# Patient Record
Sex: Male | Born: 1985 | Race: White | Hispanic: No | Marital: Single | Smoking: Never smoker
Health system: Southern US, Community
[De-identification: ages and names within clinical notes are randomized; demographics above are authoritative.]

---

## 2019-10-14 ENCOUNTER — Emergency Department (HOSPITAL_COMMUNITY): Payer: No Typology Code available for payment source

## 2019-10-14 ENCOUNTER — Emergency Department (HOSPITAL_COMMUNITY)
Admission: EM | Admit: 2019-10-14 | Discharge: 2019-10-14 | Disposition: A | Discharge status: Home / Self Care | Payer: No Typology Code available for payment source | Attending: Emergency Medicine | Admitting: Emergency Medicine

## 2019-10-14 ENCOUNTER — Encounter (HOSPITAL_COMMUNITY): Payer: Self-pay | Admitting: Emergency Medicine

## 2019-10-14 ENCOUNTER — Other Ambulatory Visit: Payer: Self-pay

## 2019-10-14 DIAGNOSIS — R42 Dizziness and giddiness: Secondary | ICD-10-CM | POA: Diagnosis not present

## 2019-10-14 DIAGNOSIS — U071 COVID-19: Secondary | ICD-10-CM | POA: Insufficient documentation

## 2019-10-14 LAB — CBC WITH DIFFERENTIAL/PLATELET
Abs Immature Granulocytes: 0.05 10*3/uL (ref 0.00–0.07)
Basophils Absolute: 0 10*3/uL (ref 0.0–0.1)
Basophils Relative: 0 %
Eosinophils Absolute: 0.1 10*3/uL (ref 0.0–0.5)
Eosinophils Relative: 3 %
HCT: 43.8 % (ref 39.0–52.0)
Hemoglobin: 14 g/dL (ref 13.0–17.0)
Immature Granulocytes: 1 %
Lymphocytes Relative: 27 %
Lymphs Abs: 1.5 10*3/uL (ref 0.7–4.0)
MCH: 27.6 pg (ref 26.0–34.0)
MCHC: 32 g/dL (ref 30.0–36.0)
MCV: 86.2 fL (ref 80.0–100.0)
Monocytes Absolute: 0.5 10*3/uL (ref 0.1–1.0)
Monocytes Relative: 10 %
Neutro Abs: 3.3 10*3/uL (ref 1.7–7.7)
Neutrophils Relative %: 59 %
Platelets: 238 10*3/uL (ref 150–400)
RBC: 5.08 MIL/uL (ref 4.22–5.81)
RDW: 13.3 % (ref 11.5–15.5)
WBC Morphology: ABNORMAL
WBC: 5.5 10*3/uL (ref 4.0–10.5)
nRBC: 0 % (ref 0.0–0.2)

## 2019-10-14 LAB — BASIC METABOLIC PANEL
Anion gap: 12 (ref 5–15)
BUN: 9 mg/dL (ref 6–20)
CO2: 23 mmol/L (ref 22–32)
Calcium: 8.9 mg/dL (ref 8.9–10.3)
Chloride: 105 mmol/L (ref 98–111)
Creatinine, Ser: 0.76 mg/dL (ref 0.61–1.24)
GFR calc Af Amer: >60 mL/min (ref >60)
GFR calc non Af Amer: >60 mL/min (ref >60)
Glucose, Bld: 99 mg/dL (ref 70–99)
Potassium: 4 mmol/L (ref 3.5–5.1)
Sodium: 140 mmol/L (ref 135–145)

## 2019-10-14 IMAGING — DX DG CHEST 1V PORT
1 series · 1 of 1 positions shown · non-contrast
Comparison: None.

CLINICAL DATA: [JM] positive, cough, tachycardia

EXAM:
PORTABLE CHEST 1 VIEW

[chest ap]
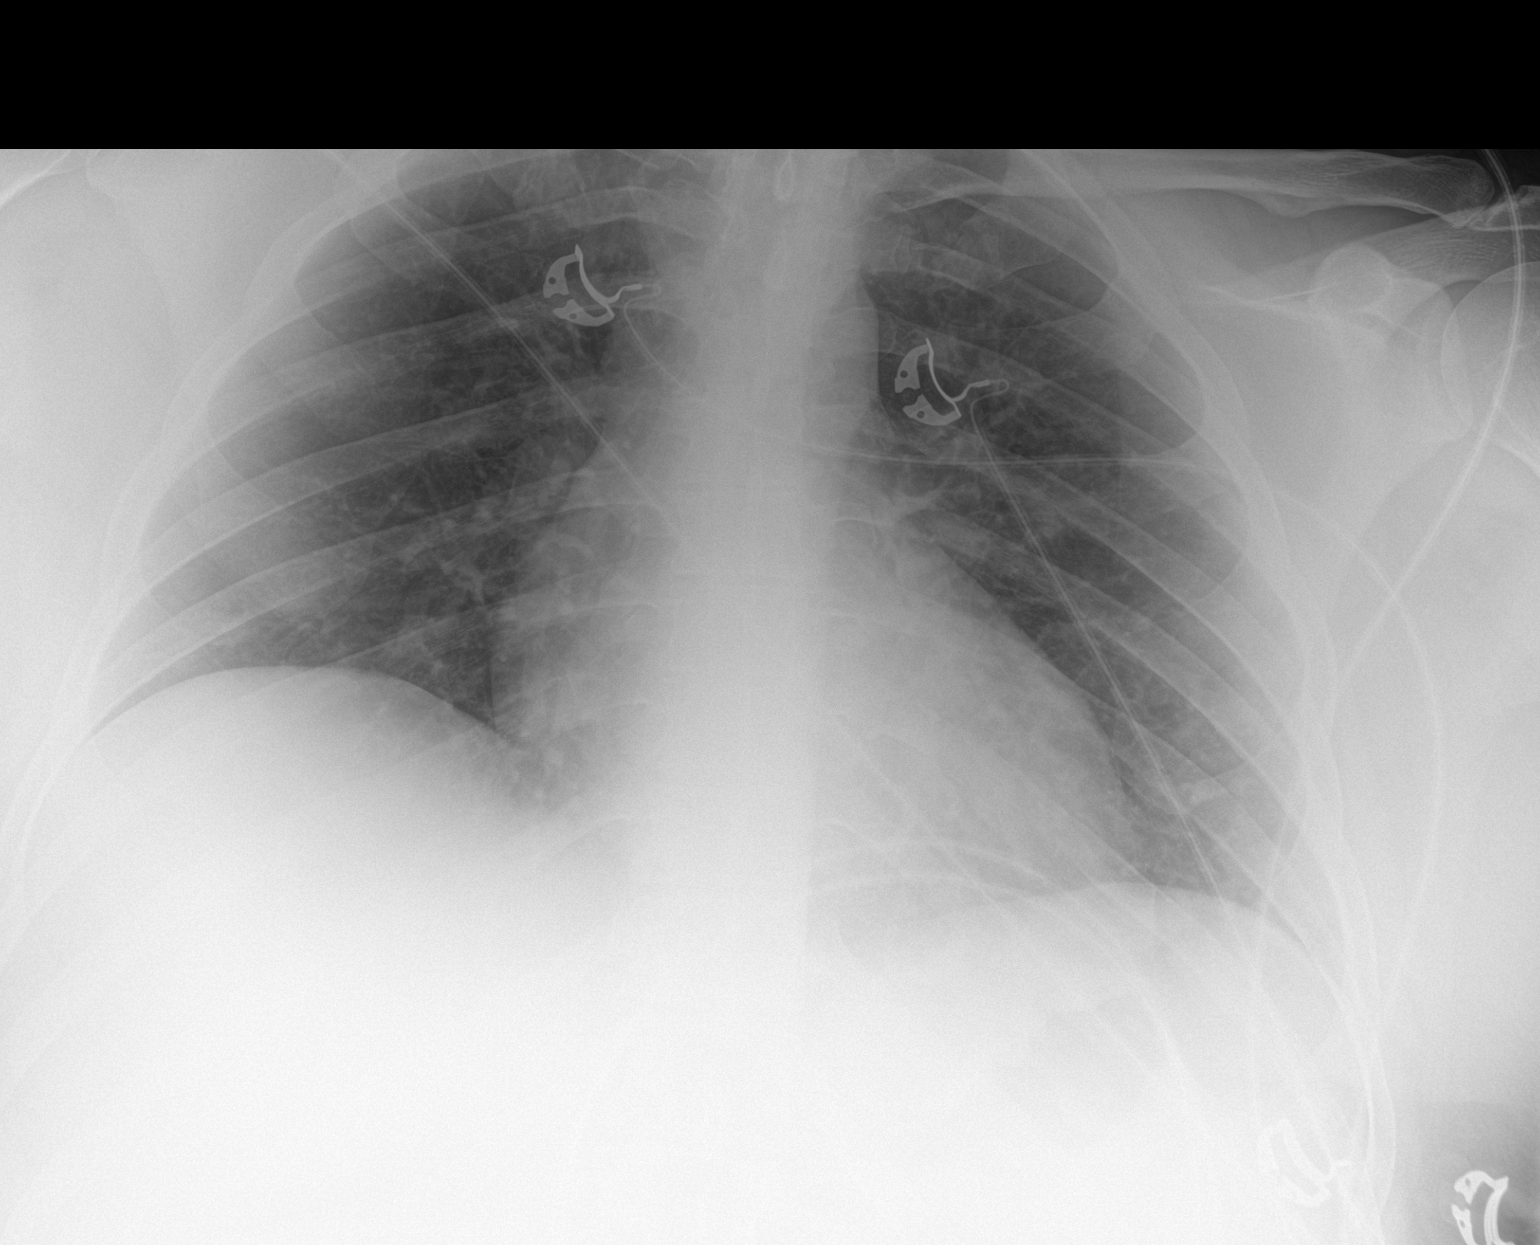

[1 of 1 positions shown; findings below may reference images not displayed]

FINDINGS: Normal heart size. Normal mediastinal contour. No pneumothorax. No
pleural effusion. Lungs appear clear, with no acute consolidative
airspace disease and no pulmonary edema.
IMPRESSION: No active disease.

## 2019-10-14 MED ORDER — ONDANSETRON 4 MG PO TBDP: 4.0000 mg | ORAL_TABLET | Freq: Three times a day (TID) | ORAL | 0 refills | Status: DC | PRN

## 2019-10-14 MED ORDER — SODIUM CHLORIDE 0.9 % IV BOLUS
1000.0000 mL | Freq: Once | INTRAVENOUS | Status: AC
  Administered 2019-10-14: 1000 mL via INTRAVENOUS

## 2019-10-14 NOTE — ED Provider Notes (Signed)
[facility]-EMERGENCY DEPT Provider Note   CSN: [id number] Arrival date & time: 10/14/19  1514     History Chief Complaint  Patient presents with  . covid positive    8/13    Victor Marquez is a 34 y.o. male who presents to ED with a chief complaint of fatigue, myalgias, productive cough, feeling like he is dehydrated.  He started having flulike symptoms on 10/03/2019.  He tested positive for Covid on 10/05/2019.  Since then he has been trying to manage his symptoms with over-the-counter cold and flu medications.  Did have some improvement in his symptoms until everything worsened about today.  He can feel his heart racing anytime he tries to exert himself.  Denies any chest pain.  Does report some decreased appetite and feeling "like I am malnourished."  Denies any neck stiffness, vomiting, bloody stools or chronic lung disease.  HPI     History reviewed. No pertinent past medical history.  There are no problems to display for this patient.   History reviewed. No pertinent surgical history.     No family history on file.  Social History   Tobacco Use  . Smoking status: Not on file  Substance Use Topics  . Alcohol use: Not on file  . Drug use: Not on file    Home Medications Prior to Admission medications   Medication Sig Start Date End Date Taking? Authorizing Provider  ondansetron (ZOFRAN ODT) 4 MG disintegrating tablet Take 1 tablet (4 mg total) by mouth every 8 (eight) hours as needed for nausea or vomiting. 10/14/19   Dietrich Pates, PA-C    Allergies    Patient has no known allergies.  Review of Systems   Review of Systems  Constitutional: Positive for appetite change, chills and fatigue. Negative for fever.  HENT: Negative for ear pain, rhinorrhea, sneezing and sore throat.   Eyes: Negative for photophobia and visual disturbance.  Respiratory: Positive for cough. Negative for chest tightness, shortness of breath and wheezing.     Cardiovascular: Negative for chest pain and palpitations.  Gastrointestinal: Negative for abdominal pain, blood in stool, constipation, diarrhea, nausea and vomiting.  Genitourinary: Negative for dysuria, hematuria and urgency.  Musculoskeletal: Positive for myalgias.  Skin: Negative for rash.  Neurological: Positive for light-headedness. Negative for dizziness and weakness.    Physical Exam Updated Vital Signs BP (!) 124/93   Pulse 74   Temp 98.9 F (37.2 C) (Oral)   Resp (!) 23   SpO2 96%   Physical Exam Vitals and nursing note reviewed.  Constitutional:      General: He is not in acute distress.    Appearance: He is well-developed. He is obese.  HENT:     Head: Normocephalic and atraumatic.     Nose: Nose normal.  Eyes:     General: No scleral icterus.       Left eye: No discharge.     Conjunctiva/sclera: Conjunctivae normal.  Cardiovascular:     Rate and Rhythm: Normal rate and regular rhythm.     Heart sounds: Normal heart sounds. No murmur heard.  No friction rub. No gallop.   Pulmonary:     Effort: Pulmonary effort is normal. No respiratory distress.     Breath sounds: Normal breath sounds.  Abdominal:     General: Bowel sounds are normal. There is no distension.     Palpations: Abdomen is soft.     Tenderness: There is no abdominal tenderness. There is no guarding.  Musculoskeletal:        General: Normal range of motion.     Cervical back: Normal range of motion and neck supple.  Skin:    General: Skin is warm and dry.     Findings: No rash.  Neurological:     Mental Status: He is alert.     Motor: No abnormal muscle tone.     Coordination: Coordination normal.     ED Results / Procedures / Treatments   Labs (all labs ordered are listed, but only abnormal results are displayed) Labs Reviewed  BASIC METABOLIC PANEL  CBC WITH DIFFERENTIAL/PLATELET    EKG EKG Interpretation  Date/Time:  Sunday October 14 2019 15:54:52 EDT Ventricular Rate:   87 PR Interval:    QRS Duration: 102 QT Interval:  375 QTC Calculation: 452 R Axis:   55 Text Interpretation: Sinus rhythm No old tracing to compare Confirmed by Linwood Dibbles [phone]) on 10/14/2019 4:16:46 PM   Radiology DG Chest Portable 1 View  Result Date: 10/14/2019 CLINICAL DATA:  COVID-19 positive, cough, tachycardia EXAM: PORTABLE CHEST 1 VIEW COMPARISON:  None. FINDINGS: Normal heart size. Normal mediastinal contour. No pneumothorax. No pleural effusion. Lungs appear clear, with no acute consolidative airspace disease and no pulmonary edema. IMPRESSION: No active disease. Electronically Signed   By: Delbert Phenix M.D.   On: 10/14/2019 16:52    Procedures Procedures (including critical care time)  Medications Ordered in ED Medications  sodium chloride 0.9 % bolus 1,000 mL (1,000 mLs Intravenous New Bag/Given 10/14/19 1626)    ED Course  I have reviewed the triage vital signs and the nursing notes.  Pertinent labs & imaging results that were available during my care of the patient were reviewed by me and considered in my medical decision making (see chart for details).    MDM Rules/Calculators/A&P                          [name] was evaluated in Emergency Department on 10/14/19 for the symptoms described in the history of present illness. He/she was evaluated in the context of the global COVID-19 pandemic, which necessitated consideration that the patient might be at risk for infection with the SARS-CoV-2 virus that causes COVID-19. Institutional protocols and algorithms that pertain to the evaluation of patients at risk for COVID-19 are in a state of rapid change based on information released by regulatory bodies including the CDC and federal and state organizations. These policies and algorithms were followed during the patient's care in the ED.  34 year old male presenting to the ED with fatigue, myalgias, productive cough and decreased appetite since being diagnosed with  Covid.  Symptoms began on 10/03/2019 and he test positive on 10/05/2019.  He has not been vaccinated against Covid.  Minimal improvement noted with over-the-counter medications.  On exam here patient initially mildly tachycardic.  He does have a cough however lungs are clear to auscultation bilaterally.  He is not hypoxic.  Chest x-ray without any acute findings.  CBC and BMP are unremarkable.  EKG shows sinus rhythm.  Patient given IV fluids here with significant improvement in his symptoms.  Tachycardia has improved.  Suspect that his symptoms are due to his Covid infection.  Will treat with Zofran as needed, advised him to continue pushing fluids and following up with PCP.   Patient is hemodynamically stable, in NAD, and able to ambulate in the ED. Evaluation does not show pathology that would require ongoing emergent  intervention or inpatient treatment. I explained the diagnosis to the patient. Pain has been managed and has no complaints prior to discharge. Patient is comfortable with above plan and is stable for discharge at this time. All questions were answered prior to disposition. Strict return precautions for returning to the ED were discussed. Encouraged follow up with PCP.   An After Visit Summary was printed and given to the patient.   Portions of this note were generated with Scientist, clinical (histocompatibility and immunogenetics). Dictation errors may occur despite best attempts at proofreading.  Final Clinical Impression(s) / ED Diagnoses Final diagnoses:  COVID-19 virus infection    Rx / DC Orders ED Discharge Orders         Ordered    ondansetron (ZOFRAN ODT) 4 MG disintegrating tablet  Every 8 hours PRN        10/14/19 1754           Dietrich Pates, PA-C 10/14/19 1802    Cathren Laine, MD 10/14/19 [phone]

## 2019-10-14 NOTE — Discharge Instructions (Signed)
Take the Zofran as needed to help with your nausea. Drink plenty of fluids and slowly advance your diet as tolerated. Return to the ER if you start to develop chest pain, shortness of breath, numbness in arms or legs, injuries or falls.

## 2019-10-14 NOTE — ED Triage Notes (Signed)
Pt reports tested positive for covid on 8/13 at [facility] on [address] in [facility] Reports that symptoms aren't getting better. Reports HR races with exertion ranging from 100-123bpm. Reports shaking a lot with chills. Reports feels like head floating around.

## 2019-10-18 ENCOUNTER — Other Ambulatory Visit: Payer: Self-pay

## 2019-10-18 ENCOUNTER — Emergency Department (HOSPITAL_COMMUNITY): Payer: No Typology Code available for payment source

## 2019-10-18 ENCOUNTER — Emergency Department (HOSPITAL_COMMUNITY)
Admission: EM | Admit: 2019-10-18 | Discharge: 2019-10-18 | Disposition: A | Discharge status: Home / Self Care | Payer: No Typology Code available for payment source | Attending: Emergency Medicine | Admitting: Emergency Medicine

## 2019-10-18 ENCOUNTER — Encounter (HOSPITAL_COMMUNITY): Payer: Self-pay

## 2019-10-18 DIAGNOSIS — H6501 Acute serous otitis media, right ear: Secondary | ICD-10-CM | POA: Diagnosis not present

## 2019-10-18 DIAGNOSIS — Z8616 Personal history of COVID-19: Secondary | ICD-10-CM | POA: Insufficient documentation

## 2019-10-18 DIAGNOSIS — R42 Dizziness and giddiness: Secondary | ICD-10-CM | POA: Diagnosis not present

## 2019-10-18 DIAGNOSIS — H65191 Other acute nonsuppurative otitis media, right ear: Secondary | ICD-10-CM

## 2019-10-18 LAB — BASIC METABOLIC PANEL
Anion gap: 9 (ref 5–15)
BUN: 10 mg/dL (ref 6–20)
CO2: 24 mmol/L (ref 22–32)
Calcium: 8.9 mg/dL (ref 8.9–10.3)
Chloride: 106 mmol/L (ref 98–111)
Creatinine, Ser: 0.76 mg/dL (ref 0.61–1.24)
GFR calc Af Amer: >60 mL/min (ref >60)
GFR calc non Af Amer: >60 mL/min (ref >60)
Glucose, Bld: 96 mg/dL (ref 70–99)
Potassium: 4.2 mmol/L (ref 3.5–5.1)
Sodium: 139 mmol/L (ref 135–145)

## 2019-10-18 LAB — CBC
HCT: 41 % (ref 39.0–52.0)
Hemoglobin: 13.5 g/dL (ref 13.0–17.0)
MCH: 28 pg (ref 26.0–34.0)
MCHC: 32.9 g/dL (ref 30.0–36.0)
MCV: 85.1 fL (ref 80.0–100.0)
Platelets: 380 10*3/uL (ref 150–400)
RBC: 4.82 MIL/uL (ref 4.22–5.81)
RDW: 13 % (ref 11.5–15.5)
WBC: 7.7 10*3/uL (ref 4.0–10.5)
nRBC: 0 % (ref 0.0–0.2)

## 2019-10-18 LAB — MAGNESIUM: Magnesium: 2.3 mg/dL (ref 1.7–2.4)

## 2019-10-18 IMAGING — MR MR HEAD W/O CM
10 series · 43 of 48 positions shown · non-contrast
Comparison: None.

CLINICAL DATA: 34-year-old male positive [OG]. Vertigo x4 days.

EXAM:
MRI HEAD WITHOUT CONTRAST
TECHNIQUE: Multiplanar, multiecho pulse sequences of the brain and surrounding
structures were obtained without intravenous contrast.

[Series 5: dwi_tracew · axial · 3.0mm · 0.88mm/px · z∈[+1,+147]mm · 8 of 102 slices shown]
[im 1/102]
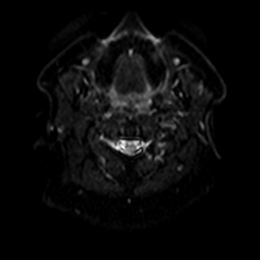
[im 19/102]
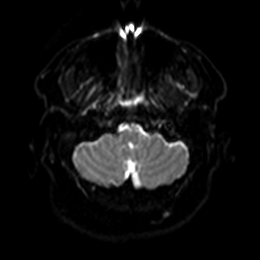
[im 28/102]
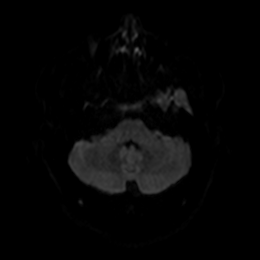
[im 46/102]
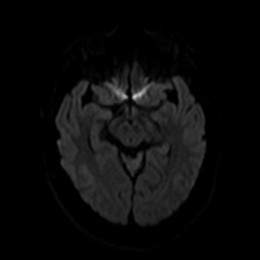
[im 56/102]
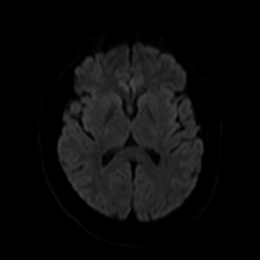
[im 74/102]
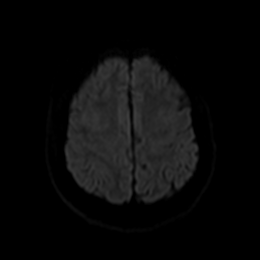
[im 83/102]
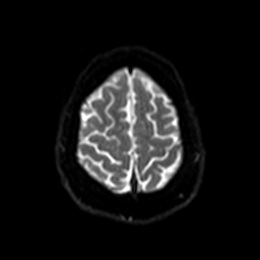
[im 102/102]
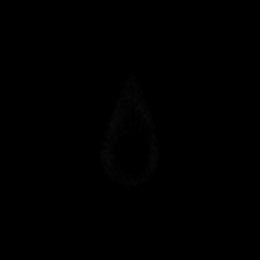

[Series 6: dwi_adc · axial · 3.0mm · 0.88mm/px · z∈[+1,+109]mm · 4 of 51 slices shown]
[im 1/51]
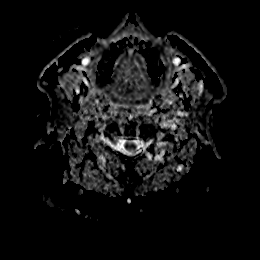
[im 13/51]
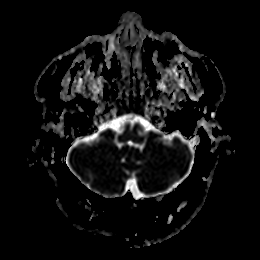
[im 26/51]
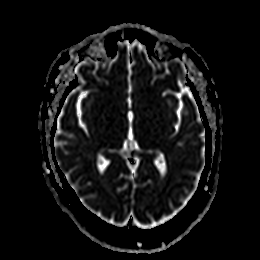
[im 38/51]
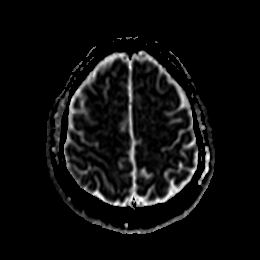

[Series 7: T2 · sagittal · 5.0mm · 0.47mm/px · 3 of 26 slices shown (1 of 3)]
[im 1/26]
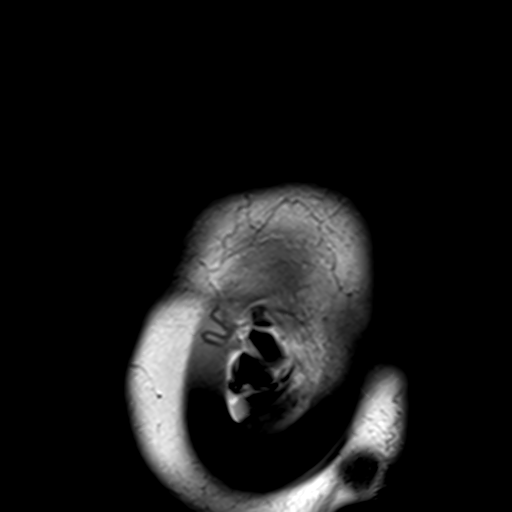
[im 13/26]
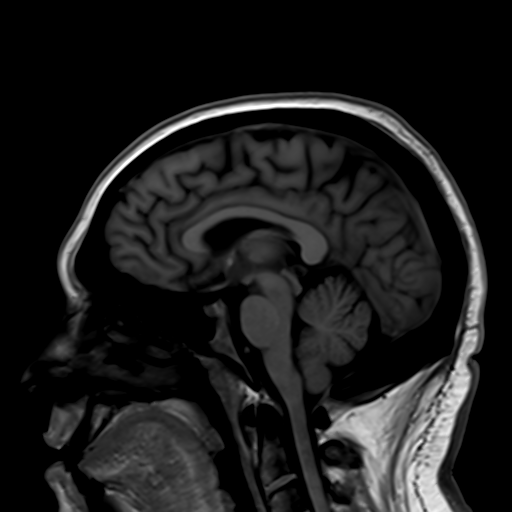
[im 26/26]
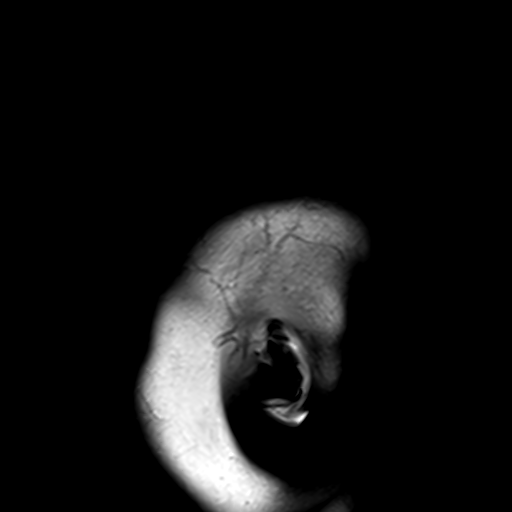

[Series 8: T2 · axial · 5.0mm · 0.45mm/px · z∈[+6,+150]mm · 3 of 24 slices shown (2 of 3)]
[im 1/24]
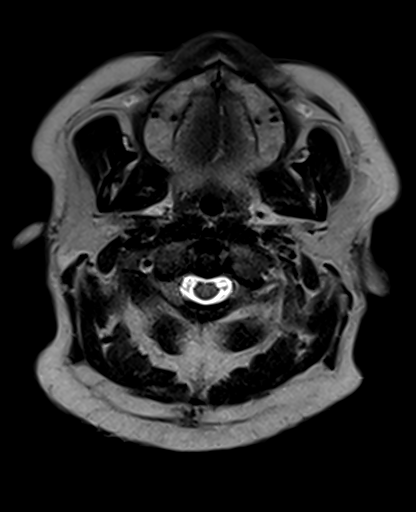
[im 12/24]
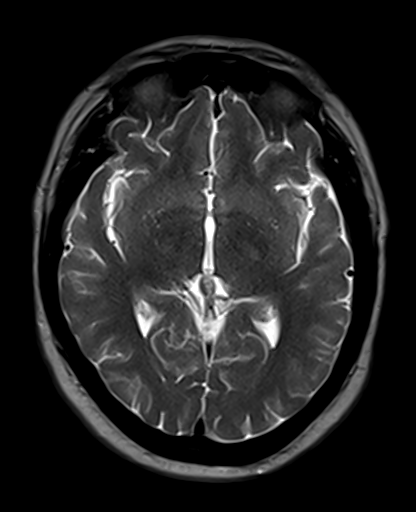
[im 24/24]
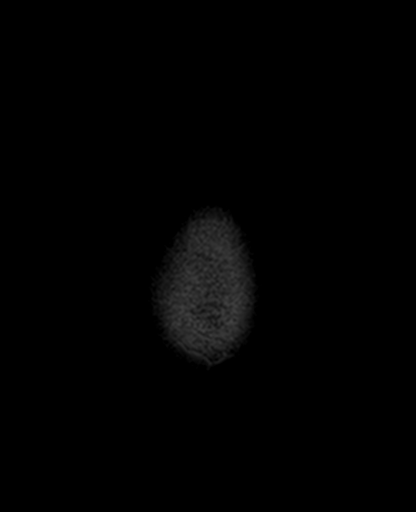

[Series 9: GRE · axial · 3.0mm · 0.45mm/px · z∈[+0,+146]mm · 5 of 51 slices shown]
[im 1/51]
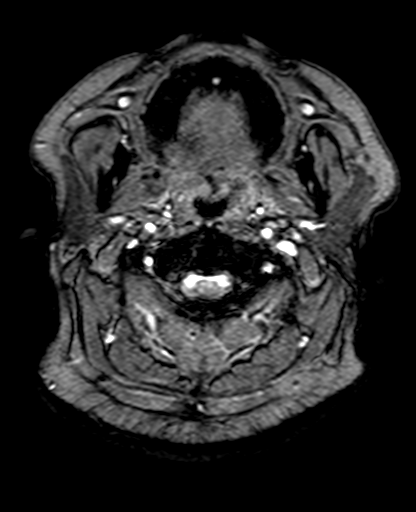
[im 13/51]
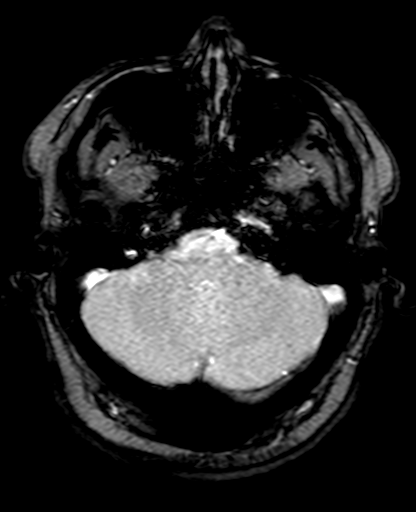
[im 26/51]
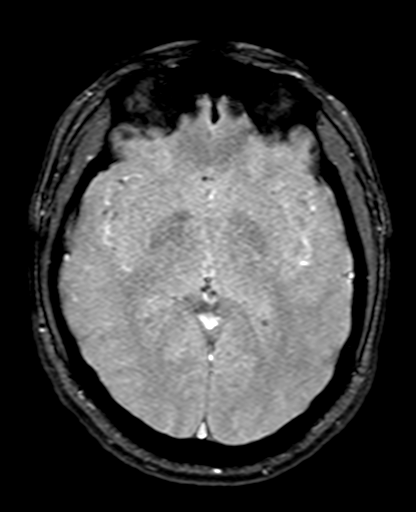
[im 38/51]
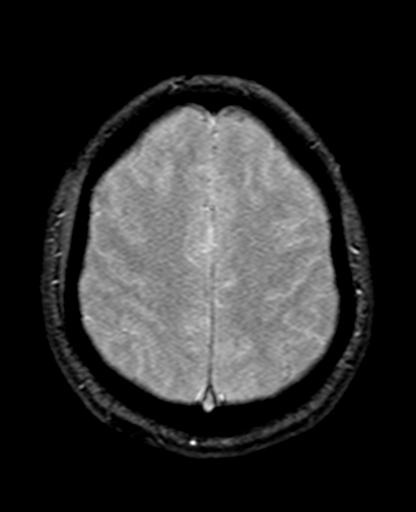
[im 51/51]
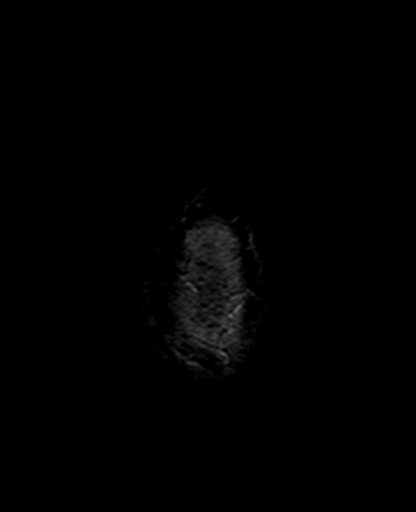

[Series 10: FLAIR · axial · 4.0mm · 0.86mm/px · z∈[+2,+148]mm · 4 of 39 slices shown]
[im 1/39]
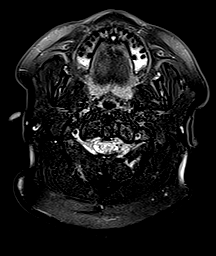
[im 13/39]
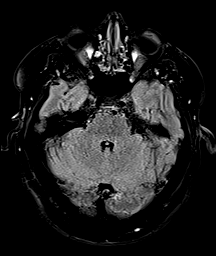
[im 26/39]
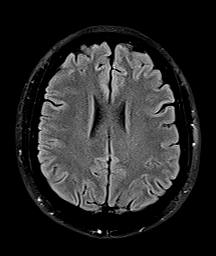
[im 39/39]
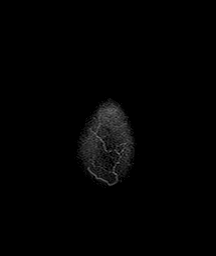

[Series 11: T1 · axial · 4.0mm · 0.45mm/px · z∈[+3,+150]mm · 4 of 39 slices shown]
[im 1/39]
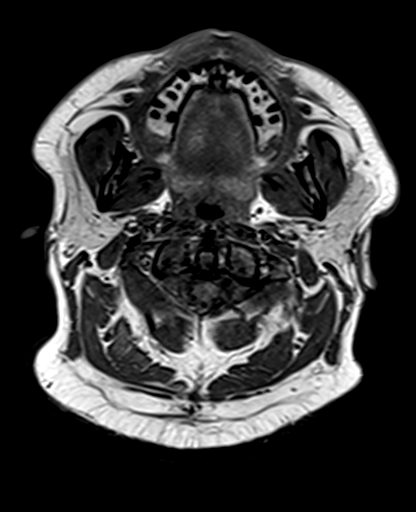
[im 13/39]
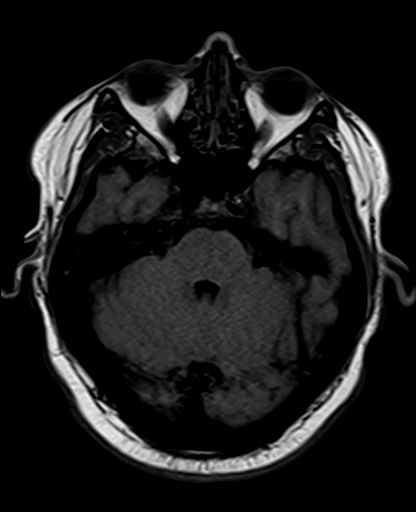
[im 26/39]
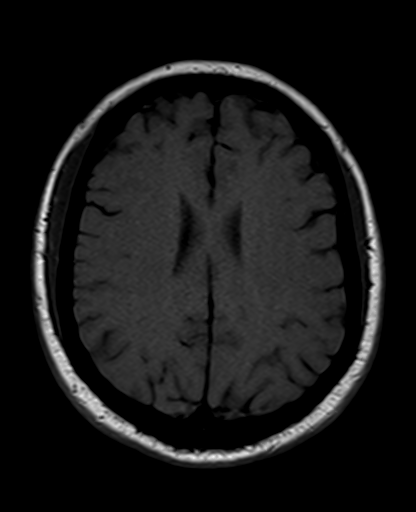
[im 39/39]
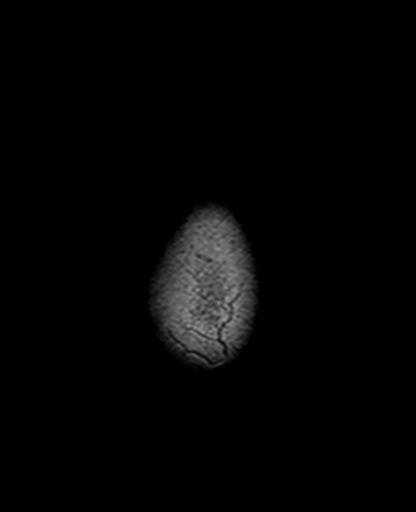

[Series 12: DWI · coronal · 5.0mm · 1.31mm/px · 6 of 60 slices shown (1 of 2)]
[im 1/60]
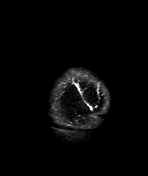
[im 12/60]
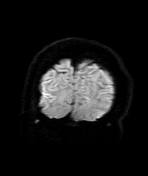
[im 24/60]
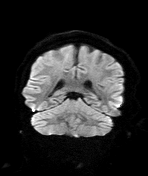
[im 36/60]
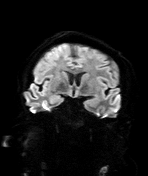
[im 48/60]
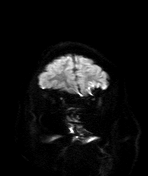
[im 60/60]
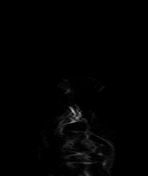

[Series 13: DWI · coronal · 5.0mm · 1.31mm/px · 3 of 30 slices shown (2 of 2)]
[im 1/30]
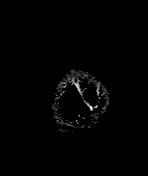
[im 15/30]
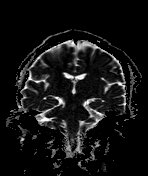
[im 30/30]
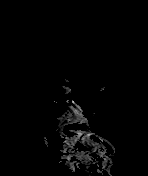

[Series 14: T2 · coronal · 5.0mm · 0.86mm/px · 3 of 29 slices shown (3 of 3)]
[im 1/29]
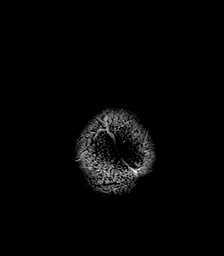
[im 15/29]
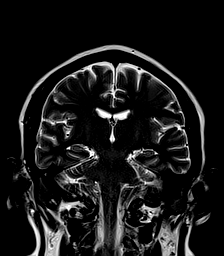
[im 29/29]
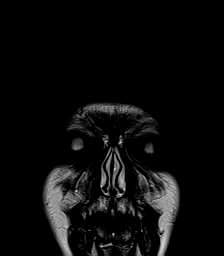

[43 of 48 positions shown; findings below may reference images not displayed]

FINDINGS: Brain: No restricted diffusion to suggest acute infarction. No
midline shift, mass effect, evidence of mass lesion,
ventriculomegaly, extra-axial collection or acute intracranial
hemorrhage. Cervicomedullary junction and pituitary are within
normal limits.

Gray and white matter signal is within normal limits throughout the
brain. No encephalomalacia or cerebral blood products identified.

Vascular: Major intracranial vascular flow voids are preserved.

Skull and upper cervical spine: Negative visible cervical spine.
Sclerotic marrow signal in the calvarium is probably due to
hyperostosis (normal variant). No suspicious marrow lesion.

Sinuses/Orbits: Negative orbits. Trace paranasal sinus mucosal
thickening.

Other: Mastoids appear clear. Grossly normal visible internal
auditory structures. Negative stylomastoid foramina. Scalp and face
appear negative.
IMPRESSION: Normal noncontrast MRI appearance of the brain. No acute
intracranial abnormality.

## 2019-10-18 MED ORDER — LACTATED RINGERS IV BOLUS
1000.0000 mL | Freq: Once | INTRAVENOUS | Status: AC
  Administered 2019-10-18: 1000 mL via INTRAVENOUS

## 2019-10-18 MED ORDER — DIAZEPAM 5 MG PO TABS: 5.0000 mg | ORAL_TABLET | Freq: Two times a day (BID) | ORAL | 0 refills | Status: DC | PRN

## 2019-10-18 MED ORDER — DIAZEPAM 5 MG PO TABS
5.0000 mg | ORAL_TABLET | Freq: Once | ORAL | Status: AC
  Administered 2019-10-18: 5 mg via ORAL
  Filled 2019-10-18: qty 1

## 2019-10-18 MED ORDER — MECLIZINE HCL 25 MG PO TABS
25.0000 mg | ORAL_TABLET | Freq: Once | ORAL | Status: AC
  Administered 2019-10-18: 25 mg via ORAL
  Filled 2019-10-18: qty 1

## 2019-10-18 NOTE — ED Provider Notes (Signed)
[facility]-EMERGENCY DEPT Provider Note   CSN: [id number] Arrival date & time: 10/18/19  4098     History Chief Complaint  Patient presents with  . Covid Positive  . Dizziness    Victor Marquez is a 34 y.o. male.   Dizziness Quality:  Head spinning Severity:  Moderate Onset quality:  Gradual Progression:  Worsening Chronicity:  New Context: not with loss of consciousness   Context comment:  Recent covid dx Relieved by: being still. Worsened by:  Nothing Ineffective treatments:  None tried Associated symptoms: hearing loss (sounds muffled in the right)   Associated symptoms: no chest pain, no diarrhea, no headaches, no nausea, no palpitations, no shortness of breath, no syncope, no tinnitus and no vomiting        History reviewed. No pertinent past medical history.  There are no problems to display for this patient.   History reviewed. No pertinent surgical history.     History reviewed. No pertinent family history.  Social History   Tobacco Use  . Smoking status: Not on file  Substance Use Topics  . Alcohol use: Not on file  . Drug use: Not on file    Home Medications Prior to Admission medications   Medication Sig Start Date End Date Taking? Authorizing Provider  diazepam (VALIUM) 5 MG tablet Take 1 tablet (5 mg total) by mouth every 12 (twelve) hours as needed for up to 10 doses for anxiety. 10/18/19   Sabino Donovan, MD  ondansetron (ZOFRAN ODT) 4 MG disintegrating tablet Take 1 tablet (4 mg total) by mouth every 8 (eight) hours as needed for nausea or vomiting. 10/14/19   Dietrich Pates, PA-C    Allergies    Patient has no known allergies.  Review of Systems   Review of Systems  Constitutional: Negative for chills and fever.  HENT: Positive for hearing loss (sounds muffled in the right). Negative for congestion, rhinorrhea and tinnitus.   Respiratory: Negative for cough and shortness of breath.   Cardiovascular: Negative for chest  pain, palpitations and syncope.  Gastrointestinal: Negative for diarrhea, nausea and vomiting.  Genitourinary: Negative for difficulty urinating and dysuria.  Musculoskeletal: Negative for arthralgias and back pain.  Skin: Negative for color change and rash.  Neurological: Positive for dizziness. Negative for light-headedness and headaches.    Physical Exam Updated Vital Signs BP (!) 145/129 (BP Location: Right Arm)   Pulse 78   Temp 97.7 F (36.5 C) (Oral)   Resp (!) 26   SpO2 98%   Physical Exam Vitals and nursing note reviewed. Exam conducted with a chaperone present.  Constitutional:      General: He is not in acute distress.    Appearance: Normal appearance.  HENT:     Head: Normocephalic and atraumatic.     Right Ear: No decreased hearing noted. No drainage or tenderness. A middle ear effusion is present. There is no impacted cerumen. No mastoid tenderness. Tympanic membrane is not injected or scarred.     Left Ear: Tympanic membrane normal. No decreased hearing noted. No drainage or tenderness.  No middle ear effusion. There is no impacted cerumen. No mastoid tenderness. Tympanic membrane is not injected or scarred.     Nose: No rhinorrhea.  Eyes:     General:        Right eye: No discharge.        Left eye: No discharge.     Conjunctiva/sclera: Conjunctivae normal.  Cardiovascular:     Rate and  Rhythm: Normal rate and regular rhythm.  Pulmonary:     Effort: Pulmonary effort is normal.     Breath sounds: No stridor.  Abdominal:     General: Abdomen is flat. There is no distension.     Palpations: Abdomen is soft.  Musculoskeletal:        General: No deformity or signs of injury.  Skin:    General: Skin is warm and dry.     Capillary Refill: Capillary refill takes less than 2 seconds.  Neurological:     General: No focal deficit present.     Mental Status: He is alert. Mental status is at baseline.     Motor: No weakness.     Comments: 5 out of 5 motor strength  in all extremities, sensation intact throughout, no dysmetria, no dysdiadochokinesia, no ataxia with ambulation, cranial nerves II through XII intact, alert and oriented to person place and time   Psychiatric:        Mood and Affect: Mood normal.        Behavior: Behavior normal.        Thought Content: Thought content normal.     ED Results / Procedures / Treatments   Labs (all labs ordered are listed, but only abnormal results are displayed) Labs Reviewed  CBC  BASIC METABOLIC PANEL  MAGNESIUM    EKG None  Radiology MR BRAIN WO CONTRAST  Result Date: 10/18/2019 CLINICAL DATA:  34 year old male positive COVID-19. Vertigo x4 days. EXAM: MRI HEAD WITHOUT CONTRAST TECHNIQUE: Multiplanar, multiecho pulse sequences of the brain and surrounding structures were obtained without intravenous contrast. COMPARISON:  None. FINDINGS: Brain: No restricted diffusion to suggest acute infarction. No midline shift, mass effect, evidence of mass lesion, ventriculomegaly, extra-axial collection or acute intracranial hemorrhage. Cervicomedullary junction and pituitary are within normal limits. Wallace Cullens and white matter signal is within normal limits throughout the brain. No encephalomalacia or cerebral blood products identified. Vascular: Major intracranial vascular flow voids are preserved. Skull and upper cervical spine: Negative visible cervical spine. Sclerotic marrow signal in the calvarium is probably due to hyperostosis (normal variant). No suspicious marrow lesion. Sinuses/Orbits: Negative orbits. Trace paranasal sinus mucosal thickening. Other: Mastoids appear clear. Grossly normal visible internal auditory structures. Negative stylomastoid foramina. Scalp and face appear negative. IMPRESSION: Normal noncontrast MRI appearance of the brain. No acute intracranial abnormality. Electronically Signed   By: Odessa Fleming M.D.   On: 10/18/2019 14:54    Procedures Procedures (including critical care  time)  Medications Ordered in ED Medications  lactated ringers bolus 1,000 mL (0 mLs Intravenous Stopped 10/18/19 1406)  meclizine (ANTIVERT) tablet 25 mg (25 mg Oral Given 10/18/19 1149)  diazepam (VALIUM) tablet 5 mg (5 mg Oral Given 10/18/19 1309)    ED Course  I have reviewed the triage vital signs and the nursing notes.  Pertinent labs & imaging results that were available during my care of the patient were reviewed by me and considered in my medical decision making (see chart for details).    MDM Rules/Calculators/A&P                         Vertigo status post Covid, symptoms are okay at rest, attempted Dix-Hallpike maneuver maneuver actually improved symptoms, never had any change with Epley maneuver.  Bilaterally it was the same.  He does have significant fluid behind the right tympanic membrane with no signs of impaction or infection, this is also consistent with his symptoms of  muffled hearing on that side.  He does not have any signs of deep space infection otherwise, he likely needs antihistamines anti-inflammatories and outpatient follow-up.  Will give IV fluids will check labs and will give meclizine.  Laboratory studies reviewed by myself show no significant derangements.  Heart rate is much improved.  However patient still symptomatic with dizziness.  I believe this is peripheral likely stemming from the increased fluid and mucus buildup in his middle ear, however even with Valium I cannot get his symptoms to improve so he will get an MRI.  Symptoms are going on for several days.  So if there is abnormality on MRI he will get neurology consult if not he will be discharged home with symptomatic control for peripheral vertigo.  Patient continues to improve.  MRI was done because could not rule out central cause of vertigo.  Is unremarkable.  He is safe for discharge home strict return precautions given.  Outpatient follow-up note with a middle ear effusion with no signs of infection.   Strict return precautions given  Final Clinical Impression(s) / ED Diagnoses Final diagnoses:  Vertigo  Acute effusion of right ear    Rx / DC Orders ED Discharge Orders         Ordered    diazepam (VALIUM) 5 MG tablet  Every 12 hours PRN        10/18/19 1524           Sabino Donovan, MD 10/18/19 1526

## 2019-10-18 NOTE — ED Triage Notes (Signed)
Pt presents with c/o being Covid positive and dizziness. Pt was diagnosed with Covid on 8/13 and was also seen here on 8/22 for symptoms. Pt reports that he is still having some dizziness and chills.

## 2019-10-18 NOTE — ED Notes (Signed)
Pt discharged from this ED in stable condition at this time. All discharge instructions and follow up care reviewed with pt with no further questions at this time. Pt ambulatory with steady gait, clear speech.  

## 2021-06-29 ENCOUNTER — Encounter (HOSPITAL_COMMUNITY): Payer: Self-pay | Admitting: Cardiology

## 2021-06-29 ENCOUNTER — Inpatient Hospital Stay (HOSPITAL_COMMUNITY)
Admission: EM | Admit: 2021-06-29 | Discharge: 2021-07-01 | DRG: 281 | Disposition: A | Discharge status: Home / Self Care | Payer: Self-pay | Attending: Cardiology | Admitting: Cardiology

## 2021-06-29 ENCOUNTER — Emergency Department (HOSPITAL_COMMUNITY): Payer: Self-pay

## 2021-06-29 ENCOUNTER — Other Ambulatory Visit: Payer: Self-pay

## 2021-06-29 ENCOUNTER — Encounter (HOSPITAL_COMMUNITY)
Admission: EM | Disposition: A | Discharge status: Home / Self Care | Payer: Self-pay | Source: Home / Self Care | Attending: Cardiology

## 2021-06-29 DIAGNOSIS — I2102 ST elevation (STEMI) myocardial infarction involving left anterior descending coronary artery: Secondary | ICD-10-CM

## 2021-06-29 DIAGNOSIS — I319 Disease of pericardium, unspecified: Secondary | ICD-10-CM | POA: Diagnosis present

## 2021-06-29 DIAGNOSIS — I25110 Atherosclerotic heart disease of native coronary artery with unstable angina pectoris: Secondary | ICD-10-CM | POA: Diagnosis present

## 2021-06-29 DIAGNOSIS — Z8249 Family history of ischemic heart disease and other diseases of the circulatory system: Secondary | ICD-10-CM

## 2021-06-29 DIAGNOSIS — Z597 Insufficient social insurance and welfare support: Secondary | ICD-10-CM

## 2021-06-29 DIAGNOSIS — I10 Essential (primary) hypertension: Secondary | ICD-10-CM | POA: Diagnosis present

## 2021-06-29 DIAGNOSIS — I2510 Atherosclerotic heart disease of native coronary artery without angina pectoris: Secondary | ICD-10-CM

## 2021-06-29 DIAGNOSIS — I2129 ST elevation (STEMI) myocardial infarction involving other sites: Principal | ICD-10-CM | POA: Diagnosis present

## 2021-06-29 DIAGNOSIS — I213 ST elevation (STEMI) myocardial infarction of unspecified site: Secondary | ICD-10-CM | POA: Diagnosis present

## 2021-06-29 DIAGNOSIS — Z6841 Body Mass Index (BMI) 40.0 and over, adult: Secondary | ICD-10-CM

## 2021-06-29 DIAGNOSIS — E785 Hyperlipidemia, unspecified: Secondary | ICD-10-CM | POA: Diagnosis present

## 2021-06-29 DIAGNOSIS — Z20822 Contact with and (suspected) exposure to covid-19: Secondary | ICD-10-CM | POA: Diagnosis present

## 2021-06-29 HISTORY — DX: ST elevation (STEMI) myocardial infarction involving left anterior descending coronary artery: I21.02

## 2021-06-29 HISTORY — PX: LEFT HEART CATH AND CORONARY ANGIOGRAPHY: CATH118249

## 2021-06-29 LAB — HEMOGLOBIN A1C
Hgb A1c MFr Bld: 5.8 % — ABNORMAL HIGH (ref 4.8–5.6)
Hgb A1c MFr Bld: 5.8 % — ABNORMAL HIGH (ref 4.8–5.6)
Mean Plasma Glucose: 119.76 mg/dL
Mean Plasma Glucose: 119.76 mg/dL

## 2021-06-29 LAB — LIPID PANEL
Cholesterol: 147 mg/dL (ref 0–200)
Cholesterol: 142 mg/dL (ref 0–200)
HDL: 39 mg/dL — ABNORMAL LOW (ref >40)
HDL: 37 mg/dL — ABNORMAL LOW (ref >40)
LDL Cholesterol: 93 mg/dL (ref 0–99)
LDL Cholesterol: 92 mg/dL (ref 0–99)
Total CHOL/HDL Ratio: 3.8 RATIO
Total CHOL/HDL Ratio: 3.8 RATIO
Triglycerides: 76 mg/dL (ref <150)
Triglycerides: 63 mg/dL (ref <150)
VLDL: 15 mg/dL (ref 0–40)
VLDL: 13 mg/dL (ref 0–40)

## 2021-06-29 LAB — CBC
HCT: 41.2 % (ref 39.0–52.0)
HCT: 39.6 % (ref 39.0–52.0)
Hemoglobin: 13.4 g/dL (ref 13.0–17.0)
Hemoglobin: 13 g/dL (ref 13.0–17.0)
MCH: 28.2 pg (ref 26.0–34.0)
MCH: 27.5 pg (ref 26.0–34.0)
MCHC: 32.5 g/dL (ref 30.0–36.0)
MCHC: 32.8 g/dL (ref 30.0–36.0)
MCV: 86.6 fL (ref 80.0–100.0)
MCV: 83.9 fL (ref 80.0–100.0)
Platelets: 264 10*3/uL (ref 150–400)
Platelets: 277 10*3/uL (ref 150–400)
RBC: 4.76 MIL/uL (ref 4.22–5.81)
RBC: 4.72 MIL/uL (ref 4.22–5.81)
RDW: 13.2 % (ref 11.5–15.5)
RDW: 13.2 % (ref 11.5–15.5)
WBC: 13 10*3/uL — ABNORMAL HIGH (ref 4.0–10.5)
WBC: 13.4 10*3/uL — ABNORMAL HIGH (ref 4.0–10.5)
nRBC: 0 % (ref 0.0–0.2)
nRBC: 0 % (ref 0.0–0.2)

## 2021-06-29 LAB — COMPREHENSIVE METABOLIC PANEL
ALT: 23 U/L (ref 0–44)
ALT: 24 U/L (ref 0–44)
AST: 24 U/L (ref 15–41)
AST: 33 U/L (ref 15–41)
Albumin: 3.8 g/dL (ref 3.5–5.0)
Albumin: 3.6 g/dL (ref 3.5–5.0)
Alkaline Phosphatase: 50 U/L (ref 38–126)
Alkaline Phosphatase: 43 U/L (ref 38–126)
Anion gap: 11 (ref 5–15)
Anion gap: 8 (ref 5–15)
BUN: 8 mg/dL (ref 6–20)
BUN: 8 mg/dL (ref 6–20)
CO2: 25 mmol/L (ref 22–32)
CO2: 24 mmol/L (ref 22–32)
Calcium: 9 mg/dL (ref 8.9–10.3)
Calcium: 8.8 mg/dL — ABNORMAL LOW (ref 8.9–10.3)
Chloride: 103 mmol/L (ref 98–111)
Chloride: 104 mmol/L (ref 98–111)
Creatinine, Ser: 0.79 mg/dL (ref 0.61–1.24)
Creatinine, Ser: 0.77 mg/dL (ref 0.61–1.24)
GFR, Estimated: >60 mL/min (ref >60)
GFR, Estimated: >60 mL/min (ref >60)
Glucose, Bld: 111 mg/dL — ABNORMAL HIGH (ref 70–99)
Glucose, Bld: 100 mg/dL — ABNORMAL HIGH (ref 70–99)
Potassium: 3.7 mmol/L (ref 3.5–5.1)
Potassium: 3.5 mmol/L (ref 3.5–5.1)
Sodium: 139 mmol/L (ref 135–145)
Sodium: 136 mmol/L (ref 135–145)
Total Bilirubin: 0.5 mg/dL (ref 0.3–1.2)
Total Bilirubin: 0.7 mg/dL (ref 0.3–1.2)
Total Protein: 7.2 g/dL (ref 6.5–8.1)
Total Protein: 7.1 g/dL (ref 6.5–8.1)

## 2021-06-29 LAB — PROTIME-INR
INR: 1 (ref 0.8–1.2)
INR: 1.1 (ref 0.8–1.2)
Prothrombin Time: 13.5 seconds (ref 11.4–15.2)
Prothrombin Time: 14.4 seconds (ref 11.4–15.2)

## 2021-06-29 LAB — RESP PANEL BY RT-PCR (FLU A&B, COVID) ARPGX2
Influenza A by PCR: NEGATIVE
Influenza B by PCR: NEGATIVE
SARS Coronavirus 2 by RT PCR: NEGATIVE

## 2021-06-29 LAB — SEDIMENTATION RATE: Sed Rate: 10 mm/hr (ref 0–16)

## 2021-06-29 LAB — TROPONIN I (HIGH SENSITIVITY)
Troponin I (High Sensitivity): 325 ng/L (ref <18)
Troponin I (High Sensitivity): 3003 ng/L (ref <18)
Troponin I (High Sensitivity): 4294 ng/L (ref <18)

## 2021-06-29 LAB — C-REACTIVE PROTEIN: CRP: 1.5 mg/dL — ABNORMAL HIGH (ref <1.0)

## 2021-06-29 LAB — APTT
aPTT: 30 seconds (ref 24–36)
aPTT: 178 seconds (ref 24–36)

## 2021-06-29 LAB — HIV ANTIBODY (ROUTINE TESTING W REFLEX): HIV Screen 4th Generation wRfx: NONREACTIVE

## 2021-06-29 IMAGING — DX DG CHEST 1V PORT
1 series · 1 of 1 positions shown · non-contrast
Comparison: [DATE]

CLINICAL DATA: Code STEMI

EXAM:
PORTABLE CHEST 1 VIEW

[chest]
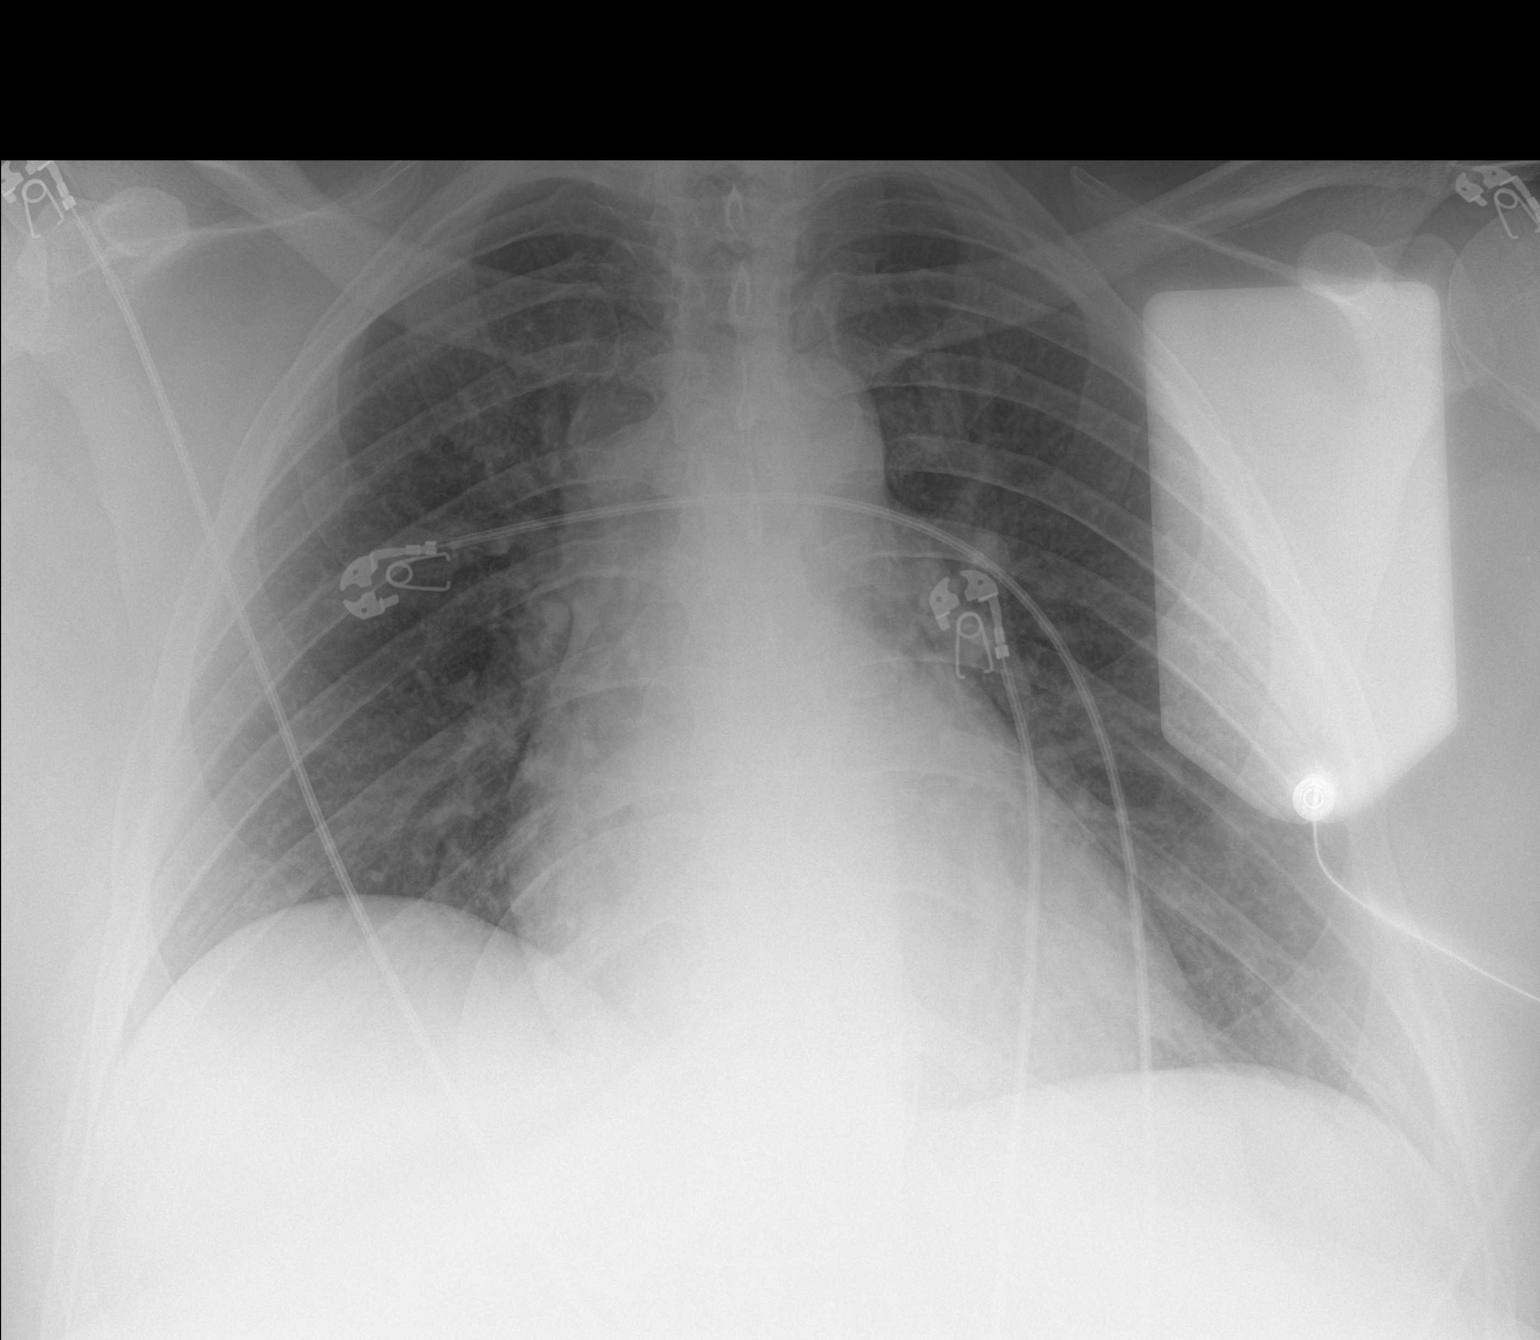

[1 of 1 positions shown; findings below may reference images not displayed]

FINDINGS: The heart size and mediastinal contours are within normal limits.
Both lungs are clear. The visualized skeletal structures are
unremarkable.
IMPRESSION: No active disease.

## 2021-06-29 SURGERY — LEFT HEART CATH AND CORONARY ANGIOGRAPHY
Anesthesia: LOCAL

## 2021-06-29 MED ORDER — HEPARIN (PORCINE) IN NACL 2-0.9 UNITS/ML
INTRAMUSCULAR | Status: DC | PRN
  Administered 2021-06-29: 10 mL via INTRA_ARTERIAL

## 2021-06-29 MED ORDER — HEPARIN (PORCINE) IN NACL 1000-0.9 UT/500ML-% IV SOLN
INTRAVENOUS | Status: DC | PRN
  Administered 2021-06-29 (×2): 500 mL

## 2021-06-29 MED ORDER — SODIUM CHLORIDE 0.9% FLUSH: 3.0000 mL | INTRAVENOUS | Status: DC | PRN

## 2021-06-29 MED ORDER — ONDANSETRON HCL 4 MG/2ML IJ SOLN: 4.0000 mg | Freq: Four times a day (QID) | INTRAMUSCULAR | Status: DC | PRN

## 2021-06-29 MED ORDER — SODIUM CHLORIDE 0.9 % WEIGHT BASED INFUSION: 1.0000 mL/kg/h | INTRAVENOUS | Status: DC

## 2021-06-29 MED ORDER — FENTANYL CITRATE (PF) 100 MCG/2ML IJ SOLN
INTRAMUSCULAR | Status: AC
  Filled 2021-06-29: qty 2

## 2021-06-29 MED ORDER — VERAPAMIL HCL 2.5 MG/ML IV SOLN
INTRAVENOUS | Status: AC
  Filled 2021-06-29: qty 2

## 2021-06-29 MED ORDER — HEPARIN (PORCINE) IN NACL 1000-0.9 UT/500ML-% IV SOLN
INTRAVENOUS | Status: AC
  Filled 2021-06-29: qty 500

## 2021-06-29 MED ORDER — ASPIRIN EC 81 MG PO TBEC
81.0000 mg | DELAYED_RELEASE_TABLET | Freq: Every day | ORAL | Status: DC
  Administered 2021-06-30: 81 mg via ORAL
  Filled 2021-06-29: qty 1

## 2021-06-29 MED ORDER — ACETAMINOPHEN 325 MG PO TABS: 650.0000 mg | ORAL_TABLET | ORAL | Status: DC | PRN

## 2021-06-29 MED ORDER — LIDOCAINE HCL (PF) 1 % IJ SOLN
INTRAMUSCULAR | Status: DC | PRN
  Administered 2021-06-29: 5 mL

## 2021-06-29 MED ORDER — LIDOCAINE HCL (PF) 1 % IJ SOLN
INTRAMUSCULAR | Status: AC
  Filled 2021-06-29: qty 30

## 2021-06-29 MED ORDER — HEPARIN SODIUM (PORCINE) 1000 UNIT/ML IJ SOLN
INTRAMUSCULAR | Status: DC | PRN
  Administered 2021-06-29: 5000 [IU] via INTRAVENOUS

## 2021-06-29 MED ORDER — FENTANYL CITRATE (PF) 100 MCG/2ML IJ SOLN
INTRAMUSCULAR | Status: DC | PRN
  Administered 2021-06-29 (×2): 25 ug via INTRAVENOUS

## 2021-06-29 MED ORDER — IOHEXOL 350 MG/ML SOLN
INTRAVENOUS | Status: DC | PRN
  Administered 2021-06-29: 65 mL

## 2021-06-29 MED ORDER — ASPIRIN 81 MG PO CHEW
324.0000 mg | CHEWABLE_TABLET | Freq: Once | ORAL | Status: AC
  Administered 2021-06-29: 243 mg via ORAL
  Filled 2021-06-29: qty 4

## 2021-06-29 MED ORDER — LABETALOL HCL 5 MG/ML IV SOLN: 10.0000 mg | INTRAVENOUS | Status: AC | PRN

## 2021-06-29 MED ORDER — ATORVASTATIN CALCIUM 80 MG PO TABS
80.0000 mg | ORAL_TABLET | Freq: Every day | ORAL | Status: DC
  Administered 2021-06-30 – 2021-07-01 (×2): 80 mg via ORAL
  Filled 2021-06-29 (×2): qty 1

## 2021-06-29 MED ORDER — MIDAZOLAM HCL 2 MG/2ML IJ SOLN
INTRAMUSCULAR | Status: DC | PRN
  Administered 2021-06-29 (×2): 1 mg via INTRAVENOUS

## 2021-06-29 MED ORDER — MIDAZOLAM HCL 2 MG/2ML IJ SOLN
INTRAMUSCULAR | Status: AC
Start: 2021-06-29 — End: ?
  Filled 2021-06-29: qty 2

## 2021-06-29 MED ORDER — ASPIRIN 81 MG PO CHEW
81.0000 mg | CHEWABLE_TABLET | Freq: Every day | ORAL | Status: DC
  Filled 2021-06-29: qty 1

## 2021-06-29 MED ORDER — HEPARIN SODIUM (PORCINE) 1000 UNIT/ML IJ SOLN
INTRAMUSCULAR | Status: AC
  Filled 2021-06-29: qty 10

## 2021-06-29 MED ORDER — SODIUM CHLORIDE 0.9 % IV SOLN
250.0000 mL | INTRAVENOUS | Status: DC | PRN
Start: 2021-06-29 — End: 2021-06-30

## 2021-06-29 MED ORDER — SODIUM CHLORIDE 0.9 % IV SOLN: 250.0000 mL | INTRAVENOUS | Status: DC | PRN

## 2021-06-29 MED ORDER — SODIUM CHLORIDE 0.9% FLUSH: 3.0000 mL | Freq: Two times a day (BID) | INTRAVENOUS | Status: DC

## 2021-06-29 MED ORDER — SODIUM CHLORIDE 0.9 % IV SOLN: INTRAVENOUS | Status: DC

## 2021-06-29 MED ORDER — HEPARIN SODIUM (PORCINE) 5000 UNIT/ML IJ SOLN
4000.0000 [IU] | Freq: Once | INTRAMUSCULAR | Status: AC
  Administered 2021-06-29: 4000 [IU] via INTRAVENOUS
  Filled 2021-06-29: qty 1

## 2021-06-29 MED ORDER — HYDRALAZINE HCL 20 MG/ML IJ SOLN: 10.0000 mg | INTRAMUSCULAR | Status: AC | PRN

## 2021-06-29 MED ORDER — SODIUM CHLORIDE 0.9 % WEIGHT BASED INFUSION: 3.0000 mL/kg/h | INTRAVENOUS | Status: DC

## 2021-06-29 MED ORDER — NITROGLYCERIN 0.4 MG SL SUBL: 0.4000 mg | SUBLINGUAL_TABLET | SUBLINGUAL | Status: DC | PRN

## 2021-06-29 SURGICAL SUPPLY — 10 items
CATH INFINITI 6F FL3.5 (CATHETERS) ×1 IMPLANT
CATH LAUNCHER 6FR JR4 (CATHETERS) ×1 IMPLANT
DEVICE RAD COMP TR BAND LRG (VASCULAR PRODUCTS) ×1 IMPLANT
GLIDESHEATH SLEND SS 6F .021 (SHEATH) ×1 IMPLANT
KIT ENCORE 26 ADVANTAGE (KITS) ×1 IMPLANT
KIT HEART LEFT (KITS) ×2 IMPLANT
KIT HEMO VALVE WATCHDOG (MISCELLANEOUS) ×1 IMPLANT
PACK CARDIAC CATHETERIZATION (CUSTOM PROCEDURE TRAY) ×2 IMPLANT
TRANSDUCER W/STOPCOCK (MISCELLANEOUS) ×2 IMPLANT
TUBING CIL FLEX 10 FLL-RA (TUBING) ×2 IMPLANT

## 2021-06-29 NOTE — ED Triage Notes (Signed)
Pt started feeling sharp pain and pressure in central chest and right and left chest. Pt feeling weak tired and just not himself. Pt was getting SOB while walking to car. Pt was sitting talking when chest pain started. ?

## 2021-06-29 NOTE — ED Notes (Signed)
Pt took one aspirin prior to arrival ?

## 2021-06-29 NOTE — Progress Notes (Signed)
Received critical APTT 178 from lab.  Notified Dr. Cherly Beach via text page. No new orders.  ?

## 2021-06-29 NOTE — Progress Notes (Signed)
TR BAND REMOVAL ?  ?LOCATION:    Right Radial ?  ?DEFLATED PER PROTOCOL:    Yes ?  ?TIME BAND OFF / DRESSING APPLIED:   2300, Gauze and Tegaderm Clean, Dry, Intact  ?  ?SITE UPON ARRIVAL:    Level 0 ?  ?SITE AFTER BAND REMOVAL:    Level 0 ?  ?CIRCULATION SENSATION AND MOVEMENT:    Yes ? ?COMMENTS:   Care instructions  ?

## 2021-06-29 NOTE — H&P (Signed)
? ?CARDIOLOGY ADMISSION NOTE  ?Patient ID: ?Victor Marquez ?MRN: [id number] ?DOB/AGE: August 20, 1985 36 y.o. ? ?Admit date: 06/29/2021 ?Primary Physician   Patient, No Pcp Per (Inactive) ?Primary Cardiologist   None ?Chief Complaint    Chest pain ? ?HPI:  ? ?The patient presents with chest pain.  He has no past cardiac history but does have a strong family history of early coronary artery disease.  He thinks his pain started about an hour ago.  He was just having a conversation.  He developed substernal discomfort.  It was also under both breasts.  I wrapped around his anterior chest and he said it felt like a very strong heartbeat.  It was 8 out of 10 in intensity.  He had some mild nausea.  He was mildly short of breath.  He had some discomfort in his posterior neck and down around his back.  Never had this before.  He did take baby aspirin before presenting himself to the emergency room. ? ?He is rather sedentary.  He is otherwise not been having any cardiovascular problems.  Does have to climb a couple flights of stairs every day.  He gets a little winded with this.  Otherwise he denies any cardiovascular symptoms.  Has not been having any palpitations, presyncope or syncope.  He has no PND or orthopnea. ? ? ?PMH:  None ? 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 ? ?No Known Allergies ?No current facility-administered medications on file prior to encounter.  ? ?Current Outpatient Medications on File Prior to Encounter  ?Medication Sig Dispense Refill  ? diazepam (VALIUM) 5 MG tablet Take 1 tablet (5 mg total) by mouth every 12 (twelve) hours as needed for up to 10 doses for anxiety. 10 tablet 0  ? ondansetron (ZOFRAN ODT) 4 MG disintegrating tablet Take 1 tablet (4 mg total) by mouth every 8 (eight) hours as needed for nausea or vomiting. 4 tablet 0  ? ?Social History  ? ?Socioeconomic History  ? Marital status: Significant Other  ?  Spouse name: Not on file  ? Number of children: Not on file  ? Years  of education: Not on file  ? Highest education level: Not on file  ?Occupational History  ? Not on file  ?Tobacco Use  ? Smoking status: Not on file  ? Smokeless tobacco: Not on file  ?Substance and Sexual Activity  ? Alcohol use: Not on file  ? Drug use: Not on file  ? Sexual activity: Not on file  ?Other Topics Concern  ? Not on file  ?Social History Narrative  ? He lives with a friend.  He has never smoked cigarettes and does not drink alcohol.  ? ?Social Determinants of Health  ? ?Financial Resource Strain: Not on file  ?Food Insecurity: Not on file  ?Transportation Needs: Not on file  ?Physical Activity: Not on file  ?Stress: Not on file  ?Social Connections: Not on file  ?Intimate Partner Violence: Not on file  ?  ?Family History  ?Problem Relation Age of Onset  ? Heart disease Father 31  ?     History of CABG.  ?Strong family history of spontaneous pneumothorax ? ?ROS:  As stated in the HPI and negative for all other systems. ? ?Physical Exam: ?Blood pressure (!) 141/76, pulse 99, temperature 98.4 ?F (36.9 ?C), temperature source Oral, resp. rate 19, height 5\' 11"  (1.803 m), weight (!) 150.6 kg, SpO2 100 %.  ?GENERAL:  Well appearing ?HEENT:  Pupils equal round  and reactive, fundi not visualized, oral mucosa unremarkable ?NECK:  No jugular venous distention, waveform within normal limits, carotid upstroke brisk and symmetric, no bruits, no thyromegaly ?LYMPHATICS:  No cervical, inguinal adenopathy ?LUNGS:  Clear to auscultation bilaterally ?BACK:  No CVA tenderness ?CHEST:  Unremarkable ?HEART:  PMI not displaced or sustained,S1 and S2 within normal limits, no S3, no S4, no clicks, no rubs, no murmurs ?ABD:  Flat, positive bowel sounds normal in frequency in pitch, no bruits, no rebound, no guarding, no midline pulsatile mass, no hepatomegaly, no splenomegaly ?EXT:  2 plus pulses throughout, no edema, no cyanosis no clubbing ?SKIN:  No rashes no nodules ?NEURO:  Cranial nerves II through XII grossly intact,  motor grossly intact throughout ?PSYCH:  Cognitively intact, oriented to person place and time ? ? ?Labs: ?Lab Results  ?Component Value Date  ? BUN 10 10/18/2019  ? ?Lab Results  ?Component Value Date  ? CREATININE 0.76 10/18/2019  ? ?Lab Results  ?Component Value Date  ? NA 139 10/18/2019  ? K 4.2 10/18/2019  ? CL 106 10/18/2019  ? CO2 24 10/18/2019  ? ?No results found for: TROPONINI ?Lab Results  ?Component Value Date  ? WBC 7.7 10/18/2019  ? HGB 13.5 10/18/2019  ? HCT 41.0 10/18/2019  ? MCV 85.1 10/18/2019  ? PLT 380 10/18/2019  ? ?No results found for: CHOL, HDL, LDLCALC, LDLDIRECT, TRIG, CHOLHDL ?No results found for: ALT, AST, GGT, ALKPHOS, BILITOT ? ?  ?Radiology:  CXR:  Pending ? ?EKG:  NSR, rate 98, axis within normal limits, intervals within normal limits, acute ST elevation in the inferior oh and lateral leads 2 mm new from previous. ? ?ASSESSMENT AND PLAN:   ? ? ?CHEST PAIN: The patient's chest pain is consistent with new onset unstable angina.  He has cardiovascular risk factors with his father having coronary disease in his 16s.  EKG is suggestive of acute myocardial infarction.  He will be taken urgently to the cardiac Cath Lab.  The patient understands that risks included but are not limited to stroke (1 in 1000), death (1 in 1000), kidney failure [usually temporary] (1 in 500), bleeding (1 in 200), allergic reaction [possibly serious] (1 in 200).  The patient understands and agrees to proceed.  ? ?RISK REDUCTION: The patient will be started on high-dose statin.  He will have education risk reduction. ? ?HTN: Blood pressure is currently elevated but he has no past history of this.  He has been managed in the context of managing his acute pain. ? ?Signed: ?Rollene Rotunda ?06/29/2021, 8:12 PM ? ? ? ?

## 2021-06-29 NOTE — ED Provider Notes (Signed)
?[facility] EMERGENCY DEPARTMENT ?Provider Note ? ? ?CSN: [id number] ?Arrival date & time: 06/29/21  1905 ? ?  ? ?History ? ?Chief Complaint  ?Patient presents with  ? Chest Pain  ? ? ?Victor Marquez is a 36 y.o. male. ? ?The history is provided by the patient.  ?Chest Pain ?Pain location:  Substernal area ?Pain quality: crushing   ?Pain radiates to:  Does not radiate ?Pain severity:  Moderate ?Onset quality:  Sudden ?Duration:  1 hour ?Timing:  Constant ?Progression:  Unchanged ?Chronicity:  New ?Context: at rest   ?Relieved by:  Nothing ?Worsened by:  Nothing ?Associated symptoms: no abdominal pain, no altered mental status, no anxiety, no back pain, no claudication, no cough, no dizziness, no dysphagia, no fatigue, no fever, no headache, no nausea, no numbness, no shortness of breath, no vomiting and no weakness   ?Risk factors: no coronary artery disease, no diabetes mellitus, no high cholesterol, no hypertension, no prior DVT/PE and no smoking   ?Risk factors comment:  FM HX of CAD IN 40s ? ?  ? ?Home Medications ?Prior to Admission medications   ?Medication Sig Start Date End Date Taking? Authorizing Provider  ?diazepam (VALIUM) 5 MG tablet Take 1 tablet (5 mg total) by mouth every 12 (twelve) hours as needed for up to 10 doses for anxiety. 10/18/19   Sabino Donovan, MD  ?ondansetron (ZOFRAN ODT) 4 MG disintegrating tablet Take 1 tablet (4 mg total) by mouth every 8 (eight) hours as needed for nausea or vomiting. 10/14/19   Dietrich Pates, PA-C  ?   ? ?Allergies    ?Patient has no known allergies.   ? ?Review of Systems   ?Review of Systems  ?Constitutional:  Negative for fatigue and fever.  ?HENT:  Negative for trouble swallowing.   ?Respiratory:  Negative for cough and shortness of breath.   ?Cardiovascular:  Positive for chest pain. Negative for claudication.  ?Gastrointestinal:  Negative for abdominal pain, nausea and vomiting.  ?Musculoskeletal:  Negative for back pain.  ?Neurological:  Negative  for dizziness, weakness, numbness and headaches.  ? ?Physical Exam ?Updated Vital Signs ?BP (!) 150/82 (BP Location: Left Arm)   Pulse 98   Temp 98.4 ?F (36.9 ?C) (Oral)   Resp 20   Ht 5\' 11"  (1.803 m)   Wt (!) 150.6 kg   SpO2 99%   BMI 46.30 kg/m?  ?Physical Exam ?Vitals and nursing note reviewed.  ?Constitutional:   ?   General: He is not in acute distress. ?   Appearance: He is well-developed. He is not ill-appearing.  ?HENT:  ?   Head: Normocephalic and atraumatic.  ?Eyes:  ?   Extraocular Movements: Extraocular movements intact.  ?   Conjunctiva/sclera: Conjunctivae normal.  ?   Pupils: Pupils are equal, round, and reactive to light.  ?Cardiovascular:  ?   Rate and Rhythm: Normal rate and regular rhythm.  ?   Pulses:     ?     Radial pulses are 2+ on the right side and 2+ on the left side.  ?   Heart sounds: Normal heart sounds. No murmur heard. ?Pulmonary:  ?   Effort: Pulmonary effort is normal. No respiratory distress.  ?   Breath sounds: Normal breath sounds. No decreased breath sounds or wheezing.  ?Abdominal:  ?   Palpations: Abdomen is soft.  ?   Tenderness: There is no abdominal tenderness.  ?Musculoskeletal:     ?   General: No swelling. Normal  range of motion.  ?   Cervical back: Normal range of motion and neck supple.  ?   Right lower leg: No edema.  ?   Left lower leg: No edema.  ?Skin: ?   General: Skin is warm and dry.  ?   Capillary Refill: Capillary refill takes less than 2 seconds.  ?Neurological:  ?   Mental Status: He is alert.  ?Psychiatric:     ?   Mood and Affect: Mood normal.  ? ? ?ED Results / Procedures / Treatments   ?Labs ?(all labs ordered are listed, but only abnormal results are displayed) ?Labs Reviewed  ?RESP PANEL BY RT-PCR (FLU A&B, COVID) ARPGX2  ?CBC  ?HEMOGLOBIN A1C  ?PROTIME-INR  ?APTT  ?COMPREHENSIVE METABOLIC PANEL  ?LIPID PANEL  ?HIV ANTIBODY (ROUTINE TESTING W REFLEX)  ?LIPOPROTEIN A (LPA)  ?TSH  ?BASIC METABOLIC PANEL  ?CBC  ?TROPONIN I (HIGH SENSITIVITY)   ? ? ?EKG ?EKG Interpretation ? ?Date/Time:  Monday Jun 29 2021 19:49:39 EDT ?Ventricular Rate:  98 ?PR Interval:  167 ?QRS Duration: 101 ?QT Interval:  343 ?QTC Calculation: 438 ?R Axis:   58 ?Text Interpretation: Sinus rhythm Probable anterolateral infarct, acute ** ** ACUTE MI / STEMI ** ** Confirmed by Lockie Mola, Eduardo Wurth ([id number]) on 06/29/2021 7:51:48 PM ? ?Radiology ?No results found. ? ?Procedures ?Marland KitchenCritical Care ?Performed by: Virgina Norfolk, DO ?Authorized by: Virgina Norfolk, DO  ? ?Critical care provider statement:  ?  Critical care time (minutes):  35 ?  Critical care was necessary to treat or prevent imminent or life-threatening deterioration of the following conditions:  Cardiac failure ?  Critical care was time spent personally by me on the following activities:  Blood draw for specimens, development of treatment plan with patient or surrogate, discussions with primary provider, evaluation of patient's response to treatment, examination of patient, obtaining history from patient or surrogate, ordering and performing treatments and interventions, ordering and review of laboratory studies, ordering and review of radiographic studies, pulse oximetry, re-evaluation of patient's condition and review of old charts ?  Care discussed with: admitting provider    ? ? ?Medications Ordered in ED ?Medications  ?0.9 %  sodium chloride infusion ( Intravenous New Bag/Given 06/29/21 1956)  ?aspirin EC tablet 81 mg (has no administration in time range)  ?nitroGLYCERIN (NITROSTAT) SL tablet 0.4 mg (has no administration in time range)  ?acetaminophen (TYLENOL) tablet 650 mg (has no administration in time range)  ?ondansetron (ZOFRAN) injection 4 mg (has no administration in time range)  ?sodium chloride flush (NS) 0.9 % injection 3 mL (has no administration in time range)  ?aspirin chewable tablet 324 mg (243 mg Oral Given 06/29/21 1951)  ?heparin injection 4,000 Units (4,000 Units Intravenous Given 06/29/21 1952)  ? ? ?ED Course/  Medical Decision Making/ A&P ?  ?                        ?Medical Decision Making ?Amount and/or Complexity of Data Reviewed ?Labs: ordered. ?Radiology: ordered. ? ?Risk ?OTC drugs. ?Prescription drug management. ?Decision regarding hospitalization. ? ? ?Victor Marquez is here with chest pain.  EKG done in triage that was brought to me showed evidence of inferior myocardial infarction.  ST elevation in the inferior leads.  Patient 36 years old.  No significant medical history.  Not a smoker.  Significant family history of heart attack in family members in their 103s.  Patient arrives unremarkable vitals.  No fever.  Talked with Dr. Antoine Poche  with cardiology and he agrees that this does appear to be consistent with an ST elevation MI.  Does not seem to be pericarditis.  Denies any recent illness.  Code STEMI was activated.  Patient given IV heparin bolus and infusion.  Patient given aspirin.  Blood work including troponin, chest x-ray ordered.  Patient taken directly to the Cath Lab prior to lab work resulting. ? ?This chart was dictated using voice recognition software.  Despite best efforts to proofread,  errors can occur which can change the documentation meaning.  ? ? ? ? ? ? ? ?Final Clinical Impression(s) / ED Diagnoses ?Final diagnoses:  ?ST elevation myocardial infarction (STEMI), unspecified artery (HCC)  ? ? ?Rx / DC Orders ?ED Discharge Orders   ? ? None  ? ?  ? ? ?  ?Virgina NorfolkCuratolo, [place] Morren, DO ?06/29/21 2009 ? ?

## 2021-06-30 ENCOUNTER — Encounter (HOSPITAL_COMMUNITY): Payer: Self-pay | Admitting: Internal Medicine

## 2021-06-30 ENCOUNTER — Inpatient Hospital Stay (HOSPITAL_COMMUNITY): Payer: Self-pay

## 2021-06-30 DIAGNOSIS — I213 ST elevation (STEMI) myocardial infarction of unspecified site: Secondary | ICD-10-CM

## 2021-06-30 DIAGNOSIS — R079 Chest pain, unspecified: Secondary | ICD-10-CM

## 2021-06-30 DIAGNOSIS — I2102 ST elevation (STEMI) myocardial infarction involving left anterior descending coronary artery: Secondary | ICD-10-CM

## 2021-06-30 DIAGNOSIS — R9431 Abnormal electrocardiogram [ECG] [EKG]: Secondary | ICD-10-CM

## 2021-06-30 DIAGNOSIS — I319 Disease of pericardium, unspecified: Secondary | ICD-10-CM

## 2021-06-30 LAB — ECHOCARDIOGRAM COMPLETE
AR max vel: 2.92 cm2
AV Area VTI: 3.1 cm2
AV Area mean vel: 3 cm2
AV Mean grad: 5 mmHg
AV Peak grad: 8.5 mmHg
Ao pk vel: 1.46 m/s
Area-P 1/2: 4.89 cm2
Height: 71 in
S' Lateral: 3.2 cm
Weight: 5329.84 oz

## 2021-06-30 LAB — BASIC METABOLIC PANEL
Anion gap: 8 (ref 5–15)
BUN: 7 mg/dL (ref 6–20)
CO2: 24 mmol/L (ref 22–32)
Calcium: 8.7 mg/dL — ABNORMAL LOW (ref 8.9–10.3)
Chloride: 105 mmol/L (ref 98–111)
Creatinine, Ser: 0.78 mg/dL (ref 0.61–1.24)
GFR, Estimated: >60 mL/min (ref >60)
Glucose, Bld: 115 mg/dL — ABNORMAL HIGH (ref 70–99)
Potassium: 3.8 mmol/L (ref 3.5–5.1)
Sodium: 137 mmol/L (ref 135–145)

## 2021-06-30 LAB — CBC
HCT: 40.6 % (ref 39.0–52.0)
Hemoglobin: 13.6 g/dL (ref 13.0–17.0)
MCH: 28 pg (ref 26.0–34.0)
MCHC: 33.5 g/dL (ref 30.0–36.0)
MCV: 83.7 fL (ref 80.0–100.0)
Platelets: 269 10*3/uL (ref 150–400)
RBC: 4.85 MIL/uL (ref 4.22–5.81)
RDW: 13.2 % (ref 11.5–15.5)
WBC: 13.2 10*3/uL — ABNORMAL HIGH (ref 4.0–10.5)
nRBC: 0 % (ref 0.0–0.2)

## 2021-06-30 LAB — TSH: TSH: 1.12 u[IU]/mL (ref 0.350–4.500)

## 2021-06-30 IMAGING — MR MR CARD MORPHOLOGY WO/W CM
45 of 48 series · 45 of 48 positions shown · IV contrast (Contrast agent)
Comparison: none

CLINICAL DATA: 35M presents with ST elevations, troponin elevation,
distal LAD disease on cath. Evaluate for myocarditis

EXAM:
CARDIAC MRI
TECHNIQUE: The patient was scanned on a 1.5 Tesla Siemens magnet. A dedicated
cardiac coil was used. Functional imaging was done using Fiesta
sequences. [DATE], and 4 chamber views were done to assess for RWMA's.
Modified TAMIL rule using a short axis stack was used to
calculate an ejection fraction on a dedicated work station using
Circle software. The patient received 10 cc of Gadavist. After 10
minutes inversion recovery sequences were used to assess for
infiltration and scar tissue.
CONTRAST:  10 cc  of Gadavist

[Series 4: t2_haste_db_tra_bh · axial · 8.0mm · 1.56mm/px · 1 of 16 slices shown]
[im 1/16]
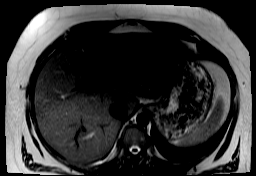

[Series 8: bSSFP · oblique · 8.0mm · 1.79mm/px · 1 of 25 slices shown (1 of 23)]
[im 1/25]
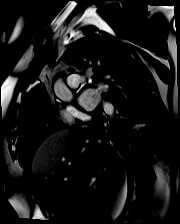

[Series 9: bSSFP · oblique · 8.0mm · 1.79mm/px · 1 of 25 slices shown (2 of 23)]
[im 1/25]
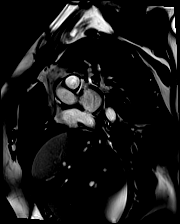

[Series 10: bSSFP · oblique · 8.0mm · 1.79mm/px · 1 of 25 slices shown (3 of 23)]
[im 1/25]
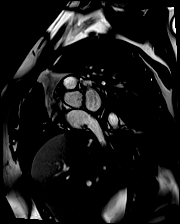

[Series 11: bSSFP · oblique · 8.0mm · 1.79mm/px · 1 of 25 slices shown (4 of 23)]
[im 1/25]
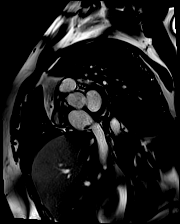

[Series 12: bSSFP · oblique · 8.0mm · 1.79mm/px · 1 of 25 slices shown (5 of 23)]
[im 1/25]
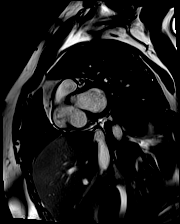

[Series 13: bSSFP · oblique · 8.0mm · 1.79mm/px · 1 of 25 slices shown (6 of 23)]
[im 1/25]
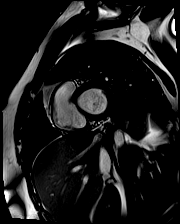

[Series 14: bSSFP · oblique · 8.0mm · 1.79mm/px · 1 of 25 slices shown (7 of 23)]
[im 1/25]
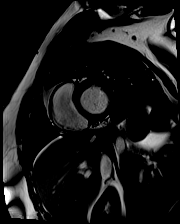

[Series 15: bSSFP · oblique · 8.0mm · 1.79mm/px · 1 of 25 slices shown (8 of 23)]
[im 1/25]
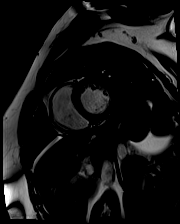

[Series 16: bSSFP · oblique · 8.0mm · 1.79mm/px · 1 of 25 slices shown (9 of 23)]
[im 1/25]
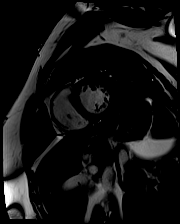

[Series 17: bSSFP · oblique · 8.0mm · 1.79mm/px · 1 of 25 slices shown (10 of 23)]
[im 1/25]
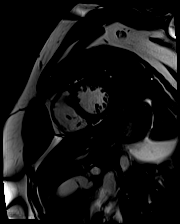

[Series 18: bSSFP · oblique · 8.0mm · 1.79mm/px · 1 of 25 slices shown (11 of 23)]
[im 1/25]
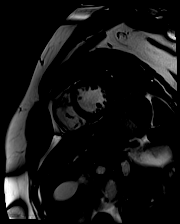

[Series 19: bSSFP · oblique · 8.0mm · 1.79mm/px · 1 of 25 slices shown (12 of 23)]
[im 1/25]
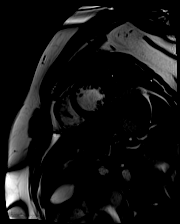

[Series 20: bSSFP · oblique · 8.0mm · 1.79mm/px · 1 of 25 slices shown (13 of 23)]
[im 1/25]
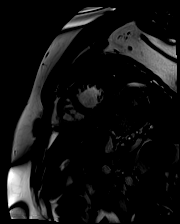

[Series 21: bSSFP · oblique · 8.0mm · 1.79mm/px · 1 of 25 slices shown (14 of 23)]
[im 1/25]
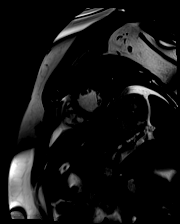

[Series 22: bSSFP · oblique · 8.0mm · 1.79mm/px · 1 of 25 slices shown (15 of 23)]
[im 1/25]
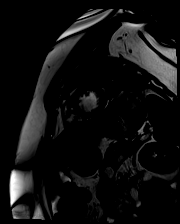

[Series 23: bSSFP · oblique · 8.0mm · 1.79mm/px · 1 of 25 slices shown (16 of 23)]
[im 1/25]
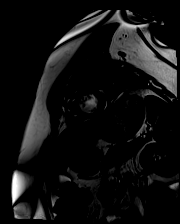

[Series 24: bSSFP · oblique · 8.0mm · 1.79mm/px · 1 of 25 slices shown (17 of 23)]
[im 1/25]
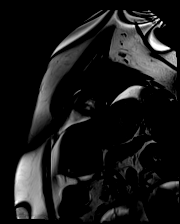

[Series 25: bSSFP · oblique · 8.0mm · 1.79mm/px · 1 of 25 slices shown (18 of 23)]
[im 1/25]
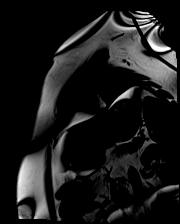

[Series 26: bSSFP · oblique · 8.0mm · 1.79mm/px · 1 of 25 slices shown (19 of 23)]
[im 1/25]
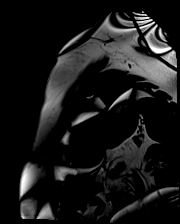

[Series 27: bSSFP · oblique · 8.0mm · 1.79mm/px · 1 of 25 slices shown (20 of 23)]
[im 1/25]
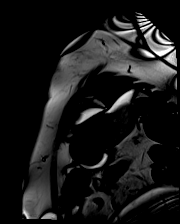

[Series 28: bSSFP · oblique · 6.0mm · 1.56mm/px · 1 of 25 slices shown (21 of 23)]
[im 1/25]
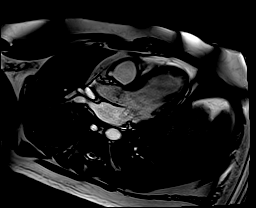

[Series 29: bSSFP · sagittal · 6.0mm · 1.41mm/px · 1 of 25 slices shown (22 of 23)]
[im 1/25]
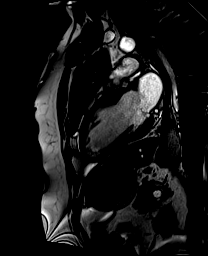

[Series 30: bSSFP · axial · 6.0mm · 1.41mm/px · 1 of 25 slices shown (23 of 23)]
[im 1/25]
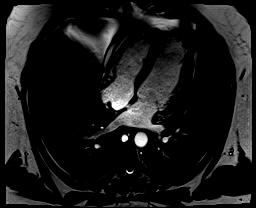

[Series 31: STIR · oblique · 8.0mm · 1.92mm/px · 1 of 16 slices shown]
[im 1/16]
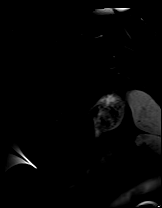

[Series 32: cine_trufi_short axis_cs_2_shot · oblique · 8.0mm · 1.56mm/px · 1 of 25 slices shown (1 of 18)]
[im 1/25]
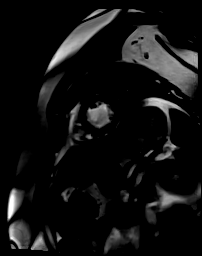

[Series 32: cine_trufi_short axis_cs_2_shot · oblique · 8.0mm · 1.56mm/px · 1 of 25 slices shown (2 of 18)]
[im 1/25]
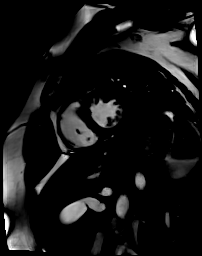

[Series 32: cine_trufi_short axis_cs_2_shot · oblique · 8.0mm · 1.56mm/px · 1 of 25 slices shown (3 of 18)]
[im 1/25]
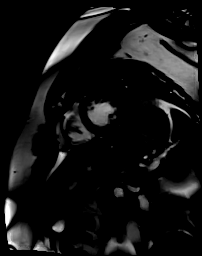

[Series 32: cine_trufi_short axis_cs_2_shot · oblique · 8.0mm · 1.56mm/px · 1 of 25 slices shown (4 of 18)]
[im 1/25]
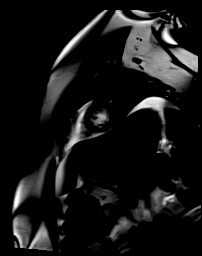

[Series 32: cine_trufi_short axis_cs_2_shot · oblique · 8.0mm · 1.56mm/px · 1 of 25 slices shown (5 of 18)]
[im 1/25]
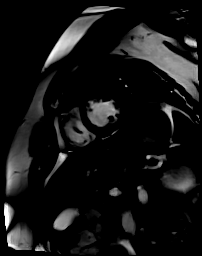

[Series 32: cine_trufi_short axis_cs_2_shot · oblique · 8.0mm · 1.56mm/px · 1 of 25 slices shown (6 of 18)]
[im 1/25]
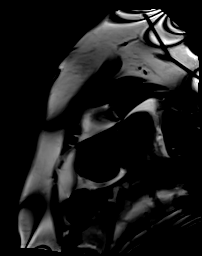

[Series 32: cine_trufi_short axis_cs_2_shot · oblique · 8.0mm · 1.56mm/px · 1 of 25 slices shown (7 of 18)]
[im 1/25]
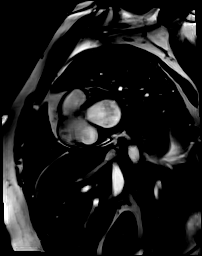

[Series 32: cine_trufi_short axis_cs_2_shot · oblique · 8.0mm · 1.56mm/px · 1 of 25 slices shown (8 of 18)]
[im 1/25]
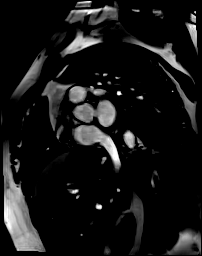

[Series 32: cine_trufi_short axis_cs_2_shot · oblique · 8.0mm · 1.56mm/px · 1 of 25 slices shown (9 of 18)]
[im 1/25]
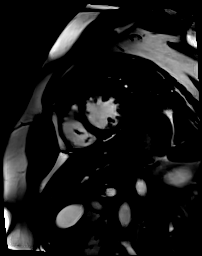

[Series 32: cine_trufi_short axis_cs_2_shot · oblique · 8.0mm · 1.56mm/px · 1 of 25 slices shown (10 of 18)]
[im 1/25]
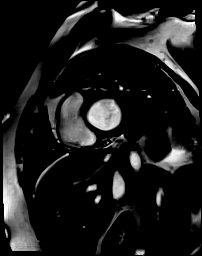

[Series 32: cine_trufi_short axis_cs_2_shot · oblique · 8.0mm · 1.56mm/px · 1 of 25 slices shown (11 of 18)]
[im 1/25]
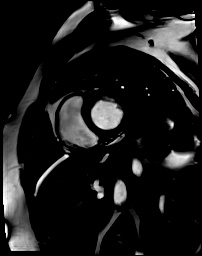

[Series 32: cine_trufi_short axis_cs_2_shot · oblique · 8.0mm · 1.56mm/px · 1 of 25 slices shown (12 of 18)]
[im 1/25]
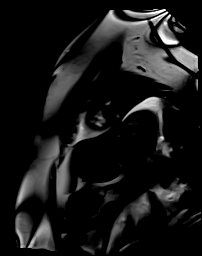

[Series 32: cine_trufi_short axis_cs_2_shot · oblique · 8.0mm · 1.56mm/px · 1 of 25 slices shown (13 of 18)]
[im 1/25]
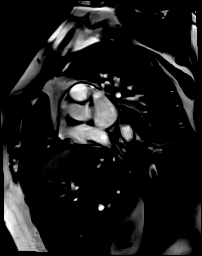

[Series 32: cine_trufi_short axis_cs_2_shot · oblique · 8.0mm · 1.56mm/px · 1 of 25 slices shown (14 of 18)]
[im 1/25]
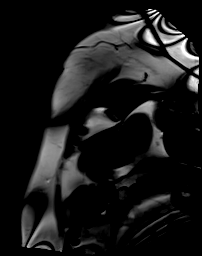

[Series 32: cine_trufi_short axis_cs_2_shot · oblique · 8.0mm · 1.56mm/px · 1 of 25 slices shown (15 of 18)]
[im 1/25]
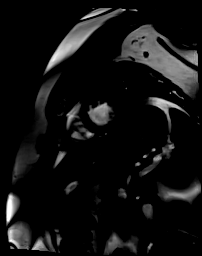

[Series 32: cine_trufi_short axis_cs_2_shot · oblique · 8.0mm · 1.56mm/px · 1 of 25 slices shown (16 of 18)]
[im 1/25]
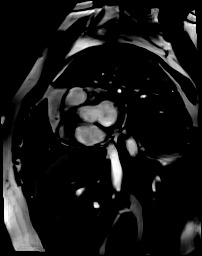

[Series 32: cine_trufi_short axis_cs_2_shot · oblique · 8.0mm · 1.56mm/px · 1 of 25 slices shown (17 of 18)]
[im 1/25]
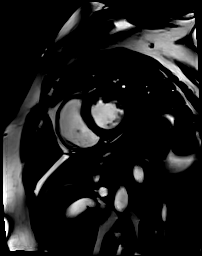

[Series 32: cine_trufi_short axis_cs_2_shot · oblique · 8.0mm · 1.56mm/px · 1 of 25 slices shown (18 of 18)]
[im 1/25]
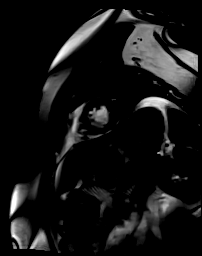

[Series 33: (id)_long_t1 · oblique · 8.0mm · 1.56mm/px · 1 of 24 slices shown]
[im 1/24]
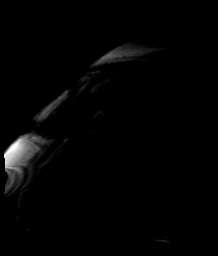

[Series 34: (id)_long_t1_moco · oblique · 8.0mm · 1.56mm/px · 1 of 24 slices shown]
[im 1/24]
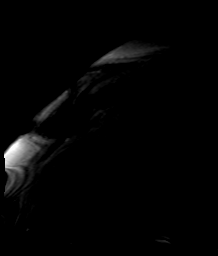

[45 of 48 positions shown; findings below may reference images not displayed]

FINDINGS: Left ventricle:

-Normal size

-Overall normal systolic function with akinesis of apical
inferior/septal walls and apex

-Normal ECV (22%)

-Normal global and regional T2 values

-Subendocardial LGE consistent with infarct in apical
inferior/septal walls and apex. LGE is greater than 50% transmural
suggesting nonviability in this territory, though infarct size is
overestimated in setting of acute MI

LV EF: 56% (Normal 56-78%)

Absolute volumes:

LV EDV: 199mL (Normal 77-195 mL)

LV ESV: 88mL (Normal 19-72 mL)

LV SV: 111mL (Normal 51-133 mL)

CO: 8.2L/min (Normal 2.8-8.8 L/min)

Indexed volumes:

LV EDV: 72mL/sq-m (Normal 47-92 mL/sq-m)

LV ESV: 32mL/sq-m (Normal 13-30 mL/sq-m)

LV SV: 40mL/sq-m (Normal 32-62 mL/sq-m)

CI: 3.0L/min/sq-m (Normal 1.7-4.2 L/min/sq-m)

Right ventricle: Normal size and systolic function

RV EF:  52% (Normal 47-74%)

Absolute volumes:

RV EDV: 220mL (Normal 88-227 mL)

RV ESV: 104mL (Normal 23-103 mL)

RV SV: 116mL (Normal 52-138 mL)

CO: 8.6L/min (Normal 2.8-8.8 L/min)

Indexed volumes:

RV EDV: 80mL/sq-m (Normal 55-105 mL/sq-m)

RV ESV: 38mL/sq-m (Normal 15-43 mL/sq-m)

RV SV: 42mL/sq-m (Normal 32-64 mL/sq-m)

CI: 3.1L/min/sq-m (Normal 1.7-4.2 L/min/sq-m)

Left atrium: Normal size

Right atrium: Normal size

Mitral valve: No regurgitation

Aortic valve: Tricuspid. No regurgitation

Tricuspid valve: No regurgitation

Pulmonic valve: No regurgitation

Aorta: Normal proximal ascending aorta

Pericardium: Normal
IMPRESSION: 1.  No evidence of myocarditis

2. Subendocardial late gadolinium enhancement in apical
inferior/septal walls and apex, consistent with distal LAD infarct.
LGE is greater than 50% transmural suggesting nonviability in this
territory, though infarct size is overestimated in setting of acute
MI

3. Normal LV size and overall normal systolic function (EF 56%) with
akinesis of apical inferior/septal walls and apex

4.  Normal RV size and systolic function (EF 52%)

## 2021-06-30 MED ORDER — IBUPROFEN 200 MG PO TABS
800.0000 mg | ORAL_TABLET | Freq: Three times a day (TID) | ORAL | Status: DC
  Administered 2021-06-30: 800 mg via ORAL
  Filled 2021-06-30: qty 4

## 2021-06-30 MED ORDER — CLOPIDOGREL BISULFATE 75 MG PO TABS
75.0000 mg | ORAL_TABLET | Freq: Every day | ORAL | Status: DC
  Administered 2021-06-30 – 2021-07-01 (×2): 75 mg via ORAL
  Filled 2021-06-30 (×3): qty 1

## 2021-06-30 MED ORDER — PANTOPRAZOLE SODIUM 40 MG PO TBEC
40.0000 mg | DELAYED_RELEASE_TABLET | Freq: Every day | ORAL | Status: DC
Start: 2021-06-30 — End: 2021-07-01
  Administered 2021-06-30 – 2021-07-01 (×2): 40 mg via ORAL
  Filled 2021-06-30 (×2): qty 1

## 2021-06-30 MED ORDER — METOPROLOL TARTRATE 12.5 MG HALF TABLET
12.5000 mg | ORAL_TABLET | Freq: Two times a day (BID) | ORAL | Status: DC
  Administered 2021-06-30 (×2): 12.5 mg via ORAL
  Filled 2021-06-30 (×2): qty 1

## 2021-06-30 MED ORDER — PERFLUTREN LIPID MICROSPHERE
1.0000 mL | INTRAVENOUS | Status: AC | PRN
  Administered 2021-06-30: 2 mL via INTRAVENOUS
  Filled 2021-06-30: qty 10

## 2021-06-30 MED ORDER — GADOBUTROL 1 MMOL/ML IV SOLN
10.0000 mL | Freq: Once | INTRAVENOUS | Status: AC | PRN
  Administered 2021-06-30: 10 mL via INTRAVENOUS

## 2021-06-30 MED ORDER — COLCHICINE 0.6 MG PO TABS
0.6000 mg | ORAL_TABLET | Freq: Two times a day (BID) | ORAL | Status: DC
Start: 2021-06-30 — End: 2021-06-30
  Administered 2021-06-30: 0.6 mg via ORAL
  Filled 2021-06-30 (×3): qty 1

## 2021-06-30 NOTE — Progress Notes (Signed)
? ?Progress Note ? ?Patient Name: Victor Marquez ?Date of Encounter: 06/30/2021 ? ?[facility] Cardiologist: None  ? ?Subjective  ? ?No additional chest pain. Reviewed episode from yesterday. Had a time when he felt "off" but not specifically chest pain, able to climb stairs, shop, etc. Was talking to his significant other, got up, and felt a severe squeezing across his chest, like someone had wrapped arms around him and was squeezing as hard as they could. Mild nausea and shortness of breath. Wife had him chew an aspirin and they presented to ER. Cath last night showed normal coronaries with the exception of 95% moderate to severe diffuse distal LAD disease, small caliber. ? ?He was started on treatment for myopericarditis. ? ?He does have a family history of premature CAD. ? ?Inpatient Medications  ?  ?Scheduled Meds: ? aspirin  81 mg Oral Daily  ? aspirin EC  81 mg Oral Daily  ? atorvastatin  80 mg Oral Daily  ? colchicine  0.6 mg Oral BID  ? ibuprofen  800 mg Oral TID  ? pantoprazole  40 mg Oral Daily  ? ?Continuous Infusions: ? ?PRN Meds: ?acetaminophen, nitroGLYCERIN, ondansetron (ZOFRAN) IV  ? ?Vital Signs  ?  ?Vitals:  ? 06/29/21 2300 06/30/21 0000 06/30/21 0300 06/30/21 0343  ?BP: 128/83  136/84   ?Pulse: 86  90   ?Resp: '18 14 17   ' ?Temp: 98.3 ?F (36.8 ?C)  98.1 ?F (36.7 ?C)   ?TempSrc: Oral  Oral   ?SpO2: 96%  98%   ?Weight:    (!) 151.1 kg  ?Height:      ? ? ?Intake/Output Summary (Last 24 hours) at 06/30/2021 0752 ?Last data filed at 06/30/2021 6063 ?Gross per 24 hour  ?Intake 245.59 ml  ?Output 950 ml  ?Net -704.41 ml  ? ? ?  06/30/2021  ?  3:43 AM 06/29/2021  ?  7:30 PM  ?Last 3 Weights  ?Weight (lbs) 333 lb 1.8 oz 332 lb  ?Weight (kg) 151.1 kg 150.594 kg  ?   ? ?Telemetry  ?  ?NSR - Personally Reviewed ? ?ECG  ?  ?NSR at 73 bpm, ST elevations resolving in inferolateral leads- Personally Reviewed ? ?Physical Exam  ? ?GEN: No acute distress.   ?Neck: No JVD ?Cardiac: RRR, no murmurs, rubs, or gallops.   ?Respiratory: Clear to auscultation bilaterally. ?GI: Soft, nontender, non-distended  ?MS: No edema; No deformity. ?Neuro:  Nonfocal  ?Psych: Normal affect  ? ?Labs  ?  ?High Sensitivity Troponin:   ?Recent Labs  ?Lab 06/29/21 ?1951 06/29/21 ?2049 06/29/21 ?2153  ?TROPONINIHS 325* 3,003* 4,294*  ?   ?Chemistry ?Recent Labs  ?Lab 06/29/21 ?1951 06/29/21 ?2049 06/30/21 ?0160  ?NA 139 136 137  ?K 3.7 3.5 3.8  ?CL 103 104 105  ?CO2 '25 24 24  ' ?GLUCOSE 111* 100* 115*  ?BUN '8 8 7  ' ?CREATININE 0.79 0.77 0.78  ?CALCIUM 9.0 8.8* 8.7*  ?PROT 7.2 7.1  --   ?ALBUMIN 3.8 3.6  --   ?AST 24 33  --   ?ALT 23 24  --   ?ALKPHOS 50 43  --   ?BILITOT 0.5 0.7  --   ?GFRNONAA >60 >60 >60  ?ANIONGAP '11 8 8  ' ?  ?Lipids  ?Recent Labs  ?Lab 06/29/21 ?2049  ?CHOL 142  ?TRIG 63  ?HDL 37*  ?[place] 92  ?CHOLHDL 3.8  ?  ?Hematology ?Recent Labs  ?Lab 06/29/21 ?1951 06/29/21 ?2049 06/30/21 ?1093  ?WBC 13.0* 13.4* 13.2*  ?  RBC 4.76 4.72 4.85  ?HGB 13.4 13.0 13.6  ?HCT 41.2 39.6 40.6  ?MCV 86.6 83.9 83.7  ?MCH 28.2 27.5 28.0  ?MCHC 32.5 32.8 33.5  ?RDW 13.2 13.2 13.2  ?PLT 264 277 269  ? ?Thyroid  ?Recent Labs  ?Lab 06/30/21 ?2952  ?TSH 1.120  ?  ?BNPNo results for input(s): BNP, PROBNP in the last 168 hours.  ?DDimer No results for input(s): DDIMER in the last 168 hours.  ? ?Radiology  ?  ?CARDIAC CATHETERIZATION ? ?Result Date: 06/29/2021 ?  Dist LAD lesion is 95% stenosed.   LV end diastolic pressure is moderately elevated.   The left ventricular ejection fraction is 55-65% by visual estimate. 1.  Moderate to severe distal of the distal LAD which is small caliber with no other high-grade obstructions or occlusions. 2.  LVEDP of 24 mmHg with preserved LV function. Recommendation: Assess CRP and echocardiogram with presumptive diagnosis of pericarditis.  Treat with secondary prevention measures due to distal LAD disease.  ? ?DG Chest Portable 1 View ? ?Result Date: 06/29/2021 ?CLINICAL DATA:  Code STEMI EXAM: PORTABLE CHEST 1 VIEW COMPARISON:   10/14/2019 FINDINGS: The heart size and mediastinal contours are within normal limits. Both lungs are clear. The visualized skeletal structures are unremarkable. IMPRESSION: No active disease. Electronically Signed   By: Donavan Foil M.D.   On: 06/29/2021 20:11   ? ?Cardiac Studies  ? ?Echo 06/30/21 pending final read ? ?Cath 06/29/21 ?  Dist LAD lesion is 95% stenosed. ?  LV end diastolic pressure is moderately elevated. ?  The left ventricular ejection fraction is 55-65% by visual estimate. ?  ?1.  Moderate to severe distal of the distal LAD which is small caliber with no other high-grade obstructions or occlusions. ?2.  LVEDP of 24 mmHg with preserved LV function. ?  ?Recommendation: Assess CRP and echocardiogram with presumptive diagnosis of pericarditis.  Treat with secondary prevention measures due to distal LAD disease. ? ?Patient Profile  ?   ?36 y.o. male with PMH hypertension, morbid obesity presenting with acute chest pain and diffuse ST elevations concerning for STEMI vs pericarditis ? ?Assessment & Plan  ?  ?Acute myopericarditis vs STEMI/NSTEMI ?-WBC elevated compared to prior, 13 vs 5-7 previously ?-CRP 1.5, ESR 10 (normal) ?-hsTn 325 > 3003 > 4294 ?-ECG shows resolving ST elevations in inferior lateral leads ?-started on ibuprofen 800 mg TID, colchicine 0.6 mg BID. ?-his echo is pending final read, but I personally reviewed. Normal EF and wall motion except for distal apex ?-I am concerned this is not myopericarditis but rather ACS. The distal LAD is too small for stenting, but if ACS would manage medically. Given the significant difference in management of ACS vs myopericarditis, will proceed with cMRI for more information. Patient has had MRI before and is amenable to proceeding. ? ?Distal vessel coronary disease ?Hyperlipidemia ?-distal LAD 95%, small caliber, diffuse disease ?-Lipids with Tchol 142, TG 63, HDL 37, LDL 92. Does not explain his CAD. Pending Lp(a) ?-started atorvastatin ?-started  aspirin as above. If cMRI suggests MI, would stop antiinflammatories/colchine and add clopidogrel ?-will add low dose beta blocker ? ?Hypertension per report ?-on no medications at home ?-BP has been acceptable here without meds ? ?Morbid obesity: BMI 46 ?-A1c 5.8 ? ?For questions or updates, please contact [place] ?Please consult [email or web address] for contact info under  ?   ?Signed, ?Buford Dresser, MD  ?06/30/2021, 7:52 AM    ?

## 2021-06-30 NOTE — Progress Notes (Signed)
Update: ?Showed patient his echo images before final read in, very concerning from my perspective for infarct. Given this, pursued cardiac MRI. Final read pending, but I reviewed with Dr. Bjorn Pippin, and it is consistent with focal infarct. ? ?Given this, will stop ibuprofen and colchicine. Added clopidogrel. Will ask cardiac rehab to see in AM. I did discuss with him that typically we monitor for 48 hours post infarct, but we will see how he is doing in the AM and potentially discharge tomorrow if doing well with no significant arrhythmias and able to walk without chest pain. ? ?Also discussed that his return to work is 5/15 at the earliest and that he will have lifting restrictions for a time. ? ?Jodelle Red, MD, PhD, [facility] ?[place]  [facility]  ?[place]  [facility] at [facility] ?[address], [address] ?[place], [place] [address] ?[phone]  ?

## 2021-06-30 NOTE — Progress Notes (Signed)
?  Echocardiogram ?2D Echocardiogram has been performed. ? ?Victor Marquez ?06/30/2021, 11:13 AM ?

## 2021-06-30 NOTE — H&P (Signed)
?Cardiology Admission History and Physical:  ? ?Patient ID: Victor Marquez ?MRN: [id number]; DOB: Feb 27, 1985  ? ?Admission date: 06/29/2021 ? ?Primary Care Provider: Patient, No Pcp Per (Inactive) ?[facility] Cardiologist: None  ?[facility] Electrophysiologist:  None  ? ?Chief Complaint: chest pain  ? ?Patient Profile:  ? ?Victor Marquez is a 36 y.o. male with HTN and morbid obesity presented with acute onset CP and diffuse ST elevations throughout inferior and anterolateral leads c/f STEMI vs pericarditis.  ? ?History of Present Illness:  ? ?Victor Marquez reported a sudden onset of crushing/squeezing chest discomfort in the left center of his chest around 6 PM on 05/08.  The pain was initially very severe and lasted several minutes with moderate relief but some persistence on presentation to the emergency department.  He was having a heated discussion with his ex-wife at the time.  He has no prior history of coronary disease or evaluation.  He does have pertinent family history of CAD in his father who suffered an MI and subsequent PCI at age 49 years old. ? ?Patient denied any associated nausea, emesis, diaphoresis, or cough.  He did have some shortness of breath while walking to the car.  He did report a very mild URI around 1 week before he had some congestion for 2-day duration.  He has not been around any sick contacts otherwise. ? ?VS in the ED: P 98, BP 150/82, RR 20, O2 99%/RA, T 98.60F ? ?During my evaluation he appeared fairly comfortable and was reporting mild to moderate persistent chest discomfort.  His ex-wife was at bedside as well.  He was not reporting severe pain like earlier in the evening.  With the ST elevations, family history of CAD, and risk factors including hypertension and obesity with a concerning story he was taken for emergent coronary angiography.  ECG with clear ST elevations were in multiple regions raising concern for pericarditis as well. ? ?History reviewed. No pertinent past medical  history. ? ?History reviewed. No pertinent surgical history.  ? ?Medications Prior to Admission: ?Prior to Admission medications   ?Medication Sig Start Date End Date Taking? Authorizing Provider  ?diazepam (VALIUM) 5 MG tablet Take 1 tablet (5 mg total) by mouth every 12 (twelve) hours as needed for up to 10 doses for anxiety. 10/18/19   Breck Coons, MD  ?ondansetron (ZOFRAN ODT) 4 MG disintegrating tablet Take 1 tablet (4 mg total) by mouth every 8 (eight) hours as needed for nausea or vomiting. 10/14/19   Delia Heady, PA-C  ?  ?Allergies:   No Known Allergies ? ?Social History:   ?Social History  ? ?Socioeconomic History  ? Marital status: Significant Other  ?  Spouse name: Not on file  ? Number of children: Not on file  ? Years of education: Not on file  ? Highest education level: Not on file  ?Occupational History  ? Not on file  ?Tobacco Use  ? Smoking status: Never  ? Smokeless tobacco: Never  ?Substance and Sexual Activity  ? Alcohol use: Not on file  ? Drug use: Not on file  ? Sexual activity: Not on file  ?Other Topics Concern  ? Not on file  ?Social History Narrative  ? He lives with a friend.  He has never smoked cigarettes and does not drink alcohol.  ? ?Social Determinants of Health  ? ?Financial Resource Strain: Not on file  ?Food Insecurity: Not on file  ?Transportation Needs: Not on file  ?Physical Activity: Not on file  ?  Stress: Not on file  ?Social Connections: Not on file  ?Intimate Partner Violence: Not on file  ?  ?Family History:   ?The patient's family history includes Heart disease (age of onset: 98) in his father.   ? ?ROS:  ? ?Review of Systems: [y] = yes, [ ]  = no   ?   ?General: Weight gain [ ] ; Weight loss [ ] ; Anorexia [ ] ; Fatigue [ ] ; Fever [ ] ; Chills [ ] ; Weakness [ ]    ?Cardiac: Chest pain/pressure [ ] ; Resting SOB [ ] ; Exertional SOB [ ] ; Orthopnea [ ] ; Pedal Edema [ ] ; Palpitations [ ] ; Syncope [ ] ; Presyncope [ ] ; Paroxysmal nocturnal dyspnea [ ]    ?Pulmonary: Cough [ ] ;  Wheezing [ ] ; Hemoptysis [ ] ; Sputum [ ] ; Snoring [ ]    ?GI: Vomiting [ ] ; Dysphagia [ ] ; Melena [ ] ; Hematochezia [ ] ; Heartburn [ ] ; Abdominal pain [ ] ; Constipation [ ] ; Diarrhea [ ] ; BRBPR [ ]    ?GU: Hematuria [ ] ; Dysuria [ ] ; Nocturia [ ]  ?Vascular: Pain in legs with walking [ ] ; Pain in feet with lying flat [ ] ; Non-healing sores [ ] ; Stroke [ ] ; TIA [ ] ; Slurred speech [ ] ;   ?Neuro: Headaches [ ] ; Vertigo [ ] ; Seizures [ ] ; Paresthesias [ ] ;Blurred vision [ ] ; Diplopia [ ] ; Vision changes [ ]    ?Ortho/Skin: Arthritis [ ] ; Joint pain [ ] ; Muscle pain [ ] ; Joint swelling [ ] ; Back Pain [ ] ; Rash [ ]    ?Psych: Depression [ ] ; Anxiety [ ]    ?Heme: Bleeding problems [ ] ; Clotting disorders [ ] ; Anemia [ ]    ?Endocrine: Diabetes [ ] ; Thyroid dysfunction [ ]   ? ?Physical Exam/Data:  ? ?Vitals:  ? 06/29/21 2300 06/30/21 0000 06/30/21 0300 06/30/21 0343  ?BP: 128/83  136/84   ?Pulse: 86  90   ?Resp: 18 14 17    ?Temp: 98.3 ?F (36.8 ?C)  98.1 ?F (36.7 ?C)   ?TempSrc: Oral  Oral   ?SpO2: 96%  98%   ?Weight:    (!) 151.1 kg  ?Height:      ? ? ?Intake/Output Summary (Last 24 hours) at 06/30/2021 0510 ?Last data filed at 06/30/2021 [id number] ?Gross per 24 hour  ?Intake 245.59 ml  ?Output 950 ml  ?Net -704.41 ml  ? ? ?  06/30/2021  ?  3:43 AM 06/29/2021  ?  7:30 PM  ?Last 3 Weights  ?Weight (lbs) 333 lb 1.8 oz 332 lb  ?Weight (kg) 151.1 kg 150.594 kg  ?   ?Body mass index is 46.46 kg/m?.  ?General: obese, well appearing, no acute distress ?HEENT: normal ?Lymph: no adenopathy ?Neck: no JVD ?Endocrine:  No thryomegaly ?Vascular: No carotid bruits; FA pulses 2+ bilaterally without bruits  ?Cardiac:  normal S1, S2; RRR; no murmur  ?Lungs:  clear to auscultation bilaterally, no wheezing, rhonchi or rales  ?Abd: soft, nontender, no hepatomegaly  ?Ext: no edema ?Musculoskeletal:  No deformities, BUE and BLE strength normal and equal ?Skin: warm and dry  ?Neuro:  CNs 2-12 intact, no focal abnormalities noted ?Psych:  Normal affect  ? ?EKG:   The ECG that was done (06/29/21, 19:49:39) was personally reviewed and demonstrates NSR, HR 98 bpm, PR 167 ms, QRS 101 ms, Qtc 438 ms, 2 mm STE in lead II, 3 mm STE in III/aVF, 1-2 mm STE in V3-V6 without reciprocal depressions  ? ?Relevant CV Studies: ?None  ? ?Laboratory Data: ? ?High Sensitivity Troponin:   ?  Recent Labs  ?Lab 06/29/21 ?1951 06/29/21 ?2049 06/29/21 ?2153  ?TROPONINIHS 325* 3,003* 4,294*  ?    ?Chemistry ?Recent Labs  ?Lab 06/29/21 ?2049 06/30/21 ?[id number]  ?NA 136 137  ?K 3.5 3.8  ?CL 104 105  ?CO2 24 24  ?GLUCOSE 100* 115*  ?BUN 8 7  ?CREATININE 0.77 0.78  ?CALCIUM 8.8* 8.7*  ?GFRNONAA >60 >60  ?ANIONGAP 8 8  ?  ?Recent Labs  ?Lab 06/29/21 ?1951 06/29/21 ?2049  ?PROT 7.2 7.1  ?ALBUMIN 3.8 3.6  ?AST 24 33  ?ALT 23 24  ?ALKPHOS 50 43  ?BILITOT 0.5 0.7  ? ?Hematology ?Recent Labs  ?Lab 06/29/21 ?2049 06/30/21 ?[id number]  ?WBC 13.4* 13.2*  ?RBC 4.72 4.85  ?HGB 13.0 13.6  ?HCT 39.6 40.6  ?MCV 83.9 83.7  ?MCH 27.5 28.0  ?MCHC 32.8 33.5  ?RDW 13.2 13.2  ?PLT 277 269  ? ?BNPNo results for input(s): BNP, PROBNP in the last 168 hours.  ?DDimer No results for input(s): DDIMER in the last 168 hours. ? ?Radiology/Studies:  ?CARDIAC CATHETERIZATION ? ?Result Date: 06/29/2021 ?  Dist LAD lesion is 95% stenosed.   LV end diastolic pressure is moderately elevated.   The left ventricular ejection fraction is 55-65% by visual estimate. 1.  Moderate to severe distal of the distal LAD which is small caliber with no other high-grade obstructions or occlusions. 2.  LVEDP of 24 mmHg with preserved LV function. Recommendation: Assess CRP and echocardiogram with presumptive diagnosis of pericarditis.  Treat with secondary prevention measures due to distal LAD disease.  ? ?DG Chest Portable 1 View ? ?Result Date: 06/29/2021 ?CLINICAL DATA:  Code STEMI EXAM: PORTABLE CHEST 1 VIEW COMPARISON:  10/14/2019 FINDINGS: The heart size and mediastinal contours are within normal limits. Both lungs are clear. The visualized skeletal structures  are unremarkable. IMPRESSION: No active disease. Electronically Signed   By: Donavan Foil M.D.   On: 06/29/2021 20:11   ? ?The patient's TIMI risk score is 2, which indicates a 2.2% risk of all cause mortality

## 2021-06-30 NOTE — Plan of Care (Signed)

## 2021-07-01 ENCOUNTER — Other Ambulatory Visit (HOSPITAL_COMMUNITY): Payer: Self-pay

## 2021-07-01 ENCOUNTER — Telehealth: Payer: Self-pay

## 2021-07-01 LAB — LIPOPROTEIN A (LPA): Lipoprotein (a): 19.6 nmol/L (ref <75.0)

## 2021-07-01 MED ORDER — ASPIRIN 81 MG PO TBEC
81.0000 mg | DELAYED_RELEASE_TABLET | Freq: Every day | ORAL | 11 refills | Status: AC
  Filled 2021-07-01: qty 30, 30d supply, fill #0

## 2021-07-01 MED ORDER — ASPIRIN EC 81 MG PO TBEC
81.0000 mg | DELAYED_RELEASE_TABLET | Freq: Every day | ORAL | Status: DC
  Administered 2021-07-01: 81 mg via ORAL
  Filled 2021-07-01: qty 1

## 2021-07-01 MED ORDER — CLOPIDOGREL BISULFATE 75 MG PO TABS
75.0000 mg | ORAL_TABLET | Freq: Every day | ORAL | 3 refills | Status: DC
  Filled 2021-07-01: qty 30, 30d supply, fill #0

## 2021-07-01 MED ORDER — ATORVASTATIN CALCIUM 80 MG PO TABS
80.0000 mg | ORAL_TABLET | Freq: Every day | ORAL | 3 refills | Status: DC
  Filled 2021-07-01: qty 30, 30d supply, fill #0

## 2021-07-01 MED ORDER — METOPROLOL SUCCINATE ER 25 MG PO TB24
25.0000 mg | ORAL_TABLET | Freq: Every day | ORAL | 3 refills | Status: DC
  Filled 2021-07-01: qty 30, 30d supply, fill #0

## 2021-07-01 MED ORDER — NITROGLYCERIN 0.4 MG SL SUBL
0.4000 mg | SUBLINGUAL_TABLET | SUBLINGUAL | 12 refills | Status: DC | PRN
  Filled 2021-07-01: qty 25, 14d supply, fill #0

## 2021-07-01 MED ORDER — METOPROLOL SUCCINATE ER 25 MG PO TB24
25.0000 mg | ORAL_TABLET | Freq: Every day | ORAL | Status: DC
  Administered 2021-07-01: 25 mg via ORAL
  Filled 2021-07-01: qty 1

## 2021-07-01 NOTE — Discharge Summary (Signed)
?Discharge Summary  ?  ?Patient ID: Victor Marquez ?MRN: [id number]; DOB: [birth year] ? ?Admit date: 06/29/2021 ?Discharge date: 07/01/2021 ? ?PCP:  Patient, No Pcp Per (Inactive) ?  ?[facility] Providers ?Cardiologist:  Buford Dresser, MD      ? ? ?Discharge Diagnoses  ?  ?Principal Problem: ?  STEMI (ST elevation myocardial infarction) ([place]) ? ?Diagnostic Studies/Procedures  ?  ?Coronary Angiography 06/29/21 ?  Dist LAD lesion is 95% stenosed. ?  LV end diastolic pressure is moderately elevated. ?  The left ventricular ejection fraction is 55-65% by visual estimate. ?  ?1.  Moderate to severe distal of the distal LAD which is small caliber with no other high-grade obstructions or occlusions. ?2.  LVEDP of 24 mmHg with preserved LV function. ?  ?Recommendation: Assess CRP and echocardiogram with presumptive diagnosis of pericarditis.  Treat with secondary prevention measures due to distal LAD disease. ? ?Echocardiogram 06/30/21 ? 1. Akinesis of the apical septum, apical inferior segment, and aepx.  ?Myocardium appears thinned concerning for distal LAD infarction. No  ?thrombus on contrast imaging. Left ventricular ejection fraction, by  ?estimation, is 55 to 60%. The left ventricle  ?has normal function. The left ventricle demonstrates regional wall motion  ?abnormalities (see scoring diagram/findings for description). Left  ?ventricular diastolic parameters were normal.  ? 2. Right ventricular systolic function is normal. The right ventricular  ?size is normal. Tricuspid regurgitation signal is inadequate for assessing  ?PA pressure.  ? 3. The mitral valve is grossly normal. No evidence of mitral valve  ?regurgitation. No evidence of mitral stenosis.  ? 4. The aortic valve is tricuspid. Aortic valve regurgitation is not  ?visualized. No aortic stenosis is present.  ? 5. The inferior vena cava is normal in size with <50% respiratory  ?variability, suggesting right atrial pressure of 8 mmHg.  ? ?Cardiac MRI  06/30/21 ?FINDINGS: ?Left ventricle: ?  ?-Normal size ?  ?-Overall normal systolic function with akinesis of apical ?inferior/septal walls and apex ?  ?-Normal ECV (22%) ?  ?-Normal global and regional T2 values ?  ?-Subendocardial LGE consistent with infarct in apical ?inferior/septal walls and apex. LGE is greater than 50% transmural ?suggesting nonviability in this territory, though infarct size is ?overestimated in setting of acute MI ?  ?LV EF: 56% (Normal 56-78%) ?  ?Absolute volumes: ?  ?LV EDV: 150m (Normal 77-195 mL)  ?LV ESV: 836m(Normal 19-72 mL)  ?LV SV: 11127mNormal 51-133 mL)  ?CO: 8.2L/min (Normal 2.8-8.8 L/min) ?  ?Indexed volumes: ?  ?LV EDV: 22m35m-m (Normal 47-92 mL/sq-m)  ?LV ESV: 32mL25mm (Normal 13-30 mL/sq-m)  ?LV SV: 40mL/79m (Normal 32-62 mL/sq-m)  ?CI: 3.0L/min/sq-m (Normal 1.7-4.2 L/min/sq-m) ?  ?Right ventricle: Normal size and systolic function ?  ?RV EF:  52% (Normal 47-74%) ?  ?  ?IMPRESSION: ?1.  No evidence of myocarditis ?  ?2. Subendocardial late gadolinium enhancement in apical ?inferior/septal walls and apex, consistent with distal LAD infarct. ?LGE is greater than 50% transmural suggesting nonviability in this ?territory, though infarct size is overestimated in setting of acute ?MI ?  ?3. Normal LV size and overall normal systolic function (EF 56%) w76% ?akinesis of apical inferior/septal walls and apex ?  ?4.  Normal RV size and systolic function (EF 52%) ?54%__________ ?  ?History of Present Illness   ?  ?Victor Noam Karaffa35 y.o36male without prior cardiac history who presented 06/29/21 with chest tightness. He rated this an 8/10, associated with shortness of breath and nausea. He chewed  an aspirin at home and sought emergency medical care. His symptoms were improved on evaluation in the ER. ? ?Hospital Course  ?   ?Consultants: none  ? ?ER evaluation was notable for ST elevation most prominent in the inferior and lateral leads. Initial hsTn was 325. He was brought to the  cath lab for invasive coronary angiography. This showed no focal proximal stenosis in any vessels and generally normal coronaries, with the exception of the distal LAD. This appeared diffusely narrowed, equivalent to 95% stenosis of small caliber vessels. It was unclear whether this explained his symptoms. He was recommended for evaluation for myopericarditis and initially started on ibuprofen and colchicine. His ESR was normal and his CRP was 1.5. HsTn rose to 3003. Echo showed very focal apical wall motion abnormality and otherwise normal EF. ST elevations were resolving. Given the above findings, there was very high suspicion that this was likely an acute coronary syndrome. To definitively determine this, he underwent cardiac MRI, which showed focal apical infarct consistent with MI and no evidence of myocarditis. ? ?He was feeling well, without chest pain. Ibuprofen and colchicine were stopped, and he was started on aspirin, clopidogrel, atorvastatin, and metoprolol this admission. ? ?His lipids were unremarkable, with tchol 142, HDL 37, LDL 92, and TG 63. His lp(a) was normal at 19.6. He does have a family history of premature heart disease, but the mechanism of this is unclear. ? ?We discussed activity limitations, cardiac rehab, and lifestyle management. He was stable for discharge to home after being evaluated by cardiac rehab. ? ?Did the patient have an acute coronary syndrome (MI, NSTEMI, STEMI, etc) this admission?:  Yes                              ? ?AHA/ACC Clinical Performance & Quality Measures: ?Aspirin prescribed? - Yes ?ADP Receptor Inhibitor (Plavix/Clopidogrel, Brilinta/Ticagrelor or Effient/Prasugrel) prescribed (includes medically managed patients)? - Yes ?Beta Blocker prescribed? - Yes ?High Intensity Statin (Lipitor 40-31m or Crestor 20-449m prescribed? - Yes ?EF assessed during THIS hospitalization? - Yes ?For EF <40%, was ACEI/ARB prescribed? - Not Applicable (EF >/= 4095%?For EF <40%,  Aldosterone Antagonist (Spironolactone or Eplerenone) prescribed? - Not Applicable (EF >/= 4063%?Cardiac Rehab Phase II ordered (including medically managed patients)? - Yes  ?_____________ ? ?Discharge Vitals ?Blood pressure 125/87, pulse 67, temperature 98.2 ?F (36.8 ?C), resp. rate 20, height _0  (1.803 m), weight (!) 148.9 kg, SpO2 97 %.  ?Filed Weights  ? 06/29/21 1930 06/30/21 0343 07/01/21 [id number]?Weight: (!) 150.6 kg (!) 151.1 kg (!) 148.9 kg  ? ?GEN: Well nourished, well developed in no acute distress ?NECK: No JVD ?CARDIAC: regular rhythm, normal S1 and S2, no rubs or gallops. No murmur. ?VASCULAR: Radial pulses 2+ bilaterally.  ?RESPIRATORY:  Clear to auscultation without rales, wheezing or rhonchi  ?ABDOMEN: Soft, non-tender, non-distended ?MUSCULOSKELETAL:  Moves all 4 limbs independently ?SKIN: Warm and dry, no edema ?NEUROLOGIC:  No focal neuro deficits noted. ?PSYCHIATRIC:  Normal affect   ? ?Labs & Radiologic Studies  ?  ?CBC ?Recent Labs  ?  06/29/21 ?2049 06/30/21 ?[id number]?WBC 13.4* 13.2*  ?HGB 13.0 13.6  ?HCT 39.6 40.6  ?MCV 83.9 83.7  ?PLT 277 269  ? ?Basic Metabolic Panel ?Recent Labs  ?  06/29/21 ?2049 06/30/21 ?[id number]?NA 136 137  ?K 3.5 3.8  ?CL 104 105  ?CO2 24 24  ?GLUCOSE 100* 115*  ?BUN  8 7  ?CREATININE 0.77 0.78  ?CALCIUM 8.8* 8.7*  ? ?Liver Function Tests ?Recent Labs  ?  06/29/21 ?1951 06/29/21 ?2049  ?AST 24 33  ?ALT 23 24  ?ALKPHOS 50 43  ?BILITOT 0.5 0.7  ?PROT 7.2 7.1  ?ALBUMIN 3.8 3.6  ? ?No results for input(s): LIPASE, AMYLASE in the last 72 hours. ?High Sensitivity Troponin:   ?Recent Labs  ?Lab 06/29/21 ?1951 06/29/21 ?2049 06/29/21 ?2153  ?TROPONINIHS 325* 3,003* 4,294*  ?  ?BNP ?Invalid input(s): POCBNP ?D-Dimer ?No results for input(s): DDIMER in the last 72 hours. ?Hemoglobin A1C ?Recent Labs  ?  06/29/21 ?2049  ?HGBA1C 5.8*  ? ?Fasting Lipid Panel ?Recent Labs  ?  06/29/21 ?2049  ?CHOL 142  ?HDL 37*  ?[place] 92  ?TRIG 63  ?CHOLHDL 3.8  ? ?Thyroid Function Tests ?Recent  Labs  ?  06/30/21 ?3837  ?TSH 1.120  ? ?_____________  ? ?Disposition  ? ?Pt is being discharged home today in good condition. ? ?Follow-up Plans & Appointments  ? ? ? Follow-up Information   ? ? [facility] [facility]

## 2021-07-01 NOTE — Discharge Instructions (Addendum)
Radial Site Care ?Refer to this sheet in the next few weeks. These instructions provide you with information on caring for yourself after your procedure. Your caregiver may also give you more specific instructions. Your treatment has been planned according to current medical practices, but problems sometimes occur. Call your caregiver if you have any problems or questions after your procedure. ?What to expect: ?Any bruising will usually fade within 1 to 2 weeks.  ?Blood that collects in the tissue (hematoma) may be painful to the touch. It should usually decrease in size and tenderness within 1 to 2 weeks.  ?SEEK IMMEDIATE MEDICAL CARE IF: ?You have unusual pain at the radial site.  ?You have redness, warmth, swelling, or pain at the radial site.  ?You have drainage (other than a small amount of blood on the dressing).  ?You have chills.  ?You have a fever or persistent symptoms for more than 72 hours.  ?You have a fever and your symptoms suddenly get worse.  ?Your arm becomes pale, cool, tingly, or numb.  ?You have heavy bleeding from the site. Hold pressure on the site.   ? ? ?No driving for 1 week. No lifting over 10 lbs for 2 weeks. No sexual activity for 2 weeks. You may return to work on 07/06/21. Keep procedure site clean & dry. If you notice increased pain, swelling, bleeding or pus, call/return!  You may shower, but no soaking baths/hot tubs/pools for 1 week.   ? ?

## 2021-07-01 NOTE — Care Management (Signed)
Please send meds at DC through [facility] pharmacy. Patient has been added t [facility] system for [facility]. ?Requested CMA to schedule at [facility] to establish PCP for refills and follow up.  ?

## 2021-07-01 NOTE — Telephone Encounter (Signed)
-----   Message from Leone Brand, NP sent at 07/01/2021 10:03 AM EDT ----- ?Appt TOC post MI 07/14/21  ? ?

## 2021-07-01 NOTE — Progress Notes (Signed)
CARDIAC REHAB PHASE I  ? ?PRE:  Rate/Rhythm: 58 SB ? ?  BP: sitting 125/87 ? ?  SaO2: 97 RA ? ?MODE:  Ambulation: 340 ft  ? ?POST:  Rate/Rhythm: 95 SR ? ?  BP: sitting 139/93  ? ?  SaO2: 97 RA ? ?Tolerated well, no c/o. Discussed MI, restrictions, diet, exercise, NTG and CRPII. Pt receptive, will refer to [place] virtual CRPII.  ?[id number]  ? ?Ethelda Chick CES, ACSM ?07/01/2021 ?9:04 AM ? ? ? ? ?

## 2021-07-02 NOTE — Telephone Encounter (Signed)
No answer/Voicemail box not set up.  

## 2021-07-06 NOTE — Telephone Encounter (Signed)
Left message for pt to call.

## 2021-07-07 ENCOUNTER — Telehealth: Payer: Self-pay | Admitting: Cardiology

## 2021-07-07 NOTE — Telephone Encounter (Signed)
Covering Victor Marquez's inbox today. Noted. Has f/u scheduled 5/23.  Added "discuss cardiac rehab" to appt notes. ?

## 2021-07-07 NOTE — Telephone Encounter (Signed)
LMTCB

## 2021-07-07 NOTE — Telephone Encounter (Signed)
Patient returned call. Transferred to RN.  ?

## 2021-07-07 NOTE — Telephone Encounter (Signed)
TOC. ?Patient contacted regarding discharge from [facility] on 07/01/21. ? ?Patient understands to follow up with provider Gillian Shields on 07/14/21 at 01:05 at [facility]. ?Patient understands discharge instructions? Yes. ?Patient understands medications and regiment? Yes. ?Patient understands to bring all medications to this visit? Yes. ? ?Ask patient:  Are you enrolled in My Chart: Yes. ? ?Rt wrist site clear. Denies numbness/tingling in hand/fingers, or bleeding. ?Had question about starting cardiac rehab. Advised that cardiac rehab will begin once he has his f/u with C. Walker. ? ? ? ? ? ? ? ? ? ? ? ? ? ? ? ? ? ? ? ? ? ? ? ? ? ? ? ? ? ? ?

## 2021-07-14 ENCOUNTER — Ambulatory Visit (INDEPENDENT_AMBULATORY_CARE_PROVIDER_SITE_OTHER): Payer: Self-pay | Admitting: Family

## 2021-07-14 ENCOUNTER — Encounter (HOSPITAL_BASED_OUTPATIENT_CLINIC_OR_DEPARTMENT_OTHER): Payer: Self-pay | Admitting: Family

## 2021-07-14 VITALS — BP 124/60 | HR 76 | Ht 71.0 in | Wt 321.2 lb

## 2021-07-14 DIAGNOSIS — E785 Hyperlipidemia, unspecified: Secondary | ICD-10-CM

## 2021-07-14 DIAGNOSIS — I25118 Atherosclerotic heart disease of native coronary artery with other forms of angina pectoris: Secondary | ICD-10-CM

## 2021-07-14 MED ORDER — NITROGLYCERIN 0.4 MG SL SUBL: 0.4000 mg | SUBLINGUAL_TABLET | SUBLINGUAL | 12 refills | Status: AC | PRN

## 2021-07-14 MED ORDER — CLOPIDOGREL BISULFATE 75 MG PO TABS: 75.0000 mg | ORAL_TABLET | Freq: Every day | ORAL | 3 refills | Status: AC

## 2021-07-14 MED ORDER — ATORVASTATIN CALCIUM 80 MG PO TABS: 80.0000 mg | ORAL_TABLET | Freq: Every day | ORAL | 3 refills | Status: AC

## 2021-07-14 MED ORDER — METOPROLOL SUCCINATE ER 25 MG PO TB24: 25.0000 mg | ORAL_TABLET | Freq: Every day | ORAL | 3 refills | Status: AC

## 2021-07-14 NOTE — Progress Notes (Signed)
Office Visit    Patient Name: Victor Marquez Date of Encounter: 07/14/2021  PCP:  Patient, No Pcp Per (Inactive)   Advance  Cardiologist:  Buford Dresser, MD  Advanced Practice Provider:  No care team member to display Electrophysiologist:  None      Chief Complaint    Victor Marquez is a 36 y.o. male with a hx of CAD, hyperlipidemia presents today for hospital follow up   Past Medical History    History reviewed. No pertinent past medical history. Past Surgical History:  Procedure Laterality Date   LEFT HEART CATH AND CORONARY ANGIOGRAPHY N/A 06/29/2021   Procedure: LEFT HEART CATH AND CORONARY ANGIOGRAPHY;  Surgeon: Early Osmond, MD;  Location: [facility];  Service: Cardiovascular;  Laterality: N/A;   Allergies  No Known Allergies  History of Present Illness    Victor Marquez is a 36 y.o. male with a hx of CAD, hyperlipidemia last seen while hospitalized.  Admitted 06/29/2021 with ST elevation most prominent in inferior and lateral leads.  Initial high-sensitivity troponin 325.  Cardiac catheterization showed no focal proximal stenosis in any vessels and generally normal coronaries with the exception of distal LAD.  This appeared diffusely nearly equivalent to 95% stenosis of small caliber vessels.  He was worked up for myopericarditis with ESR normal, CRP 1.5.  Echo with focal apical wall motion ability otherwise normal LVEF. Cardiac MRI showing focal apical infarct consistent with MI no evidence of myocarditis.  He is discharged on aspirin, clopidogrel, atorvastatin, metoprolol.  Presents today for follow up.  Walks for his break at work - little bit of short of breath right at the beginning -   ***  EKGs/Labs/Other Studies Reviewed:   The following studies were reviewed today:    Coronary Angiography 06/29/21   Dist LAD lesion is 95% stenosed.   LV end diastolic pressure is moderately elevated.   The left ventricular ejection  fraction is 55-65% by visual estimate.   1.  Moderate to severe distal of the distal LAD which is small caliber with no other high-grade obstructions or occlusions. 2.  LVEDP of 24 mmHg with preserved LV function.   Recommendation: Assess CRP and echocardiogram with presumptive diagnosis of pericarditis.  Treat with secondary prevention measures due to distal LAD disease.   Echocardiogram 06/30/21  1. Akinesis of the apical septum, apical inferior segment, and aepx.  Myocardium appears thinned concerning for distal LAD infarction. No  thrombus on contrast imaging. Left ventricular ejection fraction, by  estimation, is 55 to 60%. The left ventricle  has normal function. The left ventricle demonstrates regional wall motion  abnormalities (see scoring diagram/findings for description). Left  ventricular diastolic parameters were normal.   2. Right ventricular systolic function is normal. The right ventricular  size is normal. Tricuspid regurgitation signal is inadequate for assessing  PA pressure.   3. The mitral valve is grossly normal. No evidence of mitral valve  regurgitation. No evidence of mitral stenosis.   4. The aortic valve is tricuspid. Aortic valve regurgitation is not  visualized. No aortic stenosis is present.   5. The inferior vena cava is normal in size with <50% respiratory  variability, suggesting right atrial pressure of 8 mmHg.    Cardiac MRI 06/30/21 FINDINGS: Left ventricle:   -Normal size   -Overall normal systolic function with akinesis of apical inferior/septal walls and apex   -Normal ECV (22%)   -Normal global and regional T2 values   -  Subendocardial LGE consistent with infarct in apical inferior/septal walls and apex. LGE is greater than 50% transmural suggesting nonviability in this territory, though infarct size is overestimated in setting of acute MI   LV EF: 56% (Normal 56-78%)   Absolute volumes:   LV EDV: 199mL (Normal 77-195 mL)  LV ESV:  88mL (Normal 19-72 mL)  LV SV: 111mL (Normal 51-133 mL)  CO: 8.2L/min (Normal 2.8-8.8 L/min)   Indexed volumes:   LV EDV: 72mL/sq-m (Normal 47-92 mL/sq-m)  LV ESV: 32mL/sq-m (Normal 13-30 mL/sq-m)  LV SV: 40mL/sq-m (Normal 32-62 mL/sq-m)  CI: 3.0L/min/sq-m (Normal 1.7-4.2 L/min/sq-m)   Right ventricle: Normal size and systolic function   RV EF:  52% (Normal 47-74%)     IMPRESSION: 1.  No evidence of myocarditis   2. Subendocardial late gadolinium enhancement in apical inferior/septal walls and apex, consistent with distal LAD infarct. LGE is greater than 50% transmural suggesting nonviability in this territory, though infarct size is overestimated in setting of acute MI   3. Normal LV size and overall normal systolic function (EF 56%) with akinesis of apical inferior/septal walls and apex   4.  Normal RV size and systolic function (EF 52%) _____________  EKG: No EKG today  Recent Labs: 06/29/2021: ALT 24 06/30/2021: BUN 7; Creatinine, Ser 0.78; Hemoglobin 13.6; Platelets 269; Potassium 3.8; Sodium 137; TSH 1.120  Recent Lipid Panel    Component Value Date/Time   CHOL 142 06/29/2021 2049   TRIG 63 06/29/2021 2049   HDL 37 (L) 06/29/2021 2049   CHOLHDL 3.8 06/29/2021 2049   VLDL 13 06/29/2021 2049   LDLCALC 92 06/29/2021 2049    Home Medications   Current Meds  Medication Sig   aspirin 81 MG EC tablet Take 1 tablet (81 mg total) by mouth daily. Swallow whole.   [DISCONTINUED] atorvastatin (LIPITOR) 80 MG tablet Take 1 tablet (80 mg total) by mouth daily.   [DISCONTINUED] clopidogrel (PLAVIX) 75 MG tablet Take 1 tablet (75 mg total) by mouth daily.   [DISCONTINUED] metoprolol succinate (TOPROL-XL) 25 MG 24 hr tablet Take 1 tablet (25 mg total) by mouth daily.   [DISCONTINUED] nitroGLYCERIN (NITROSTAT) 0.4 MG SL tablet Place 1 tablet (0.4 mg total) under the tongue every 5 (five) minutes x 3 doses as needed for chest pain.     Review of Systems      All other  systems reviewed and are otherwise negative except as noted above.  Physical Exam    VS:  BP 124/60 (BP Location: Left Arm, Patient Position: Sitting, Cuff Size: Large)   Pulse 76   Ht 5' 11" (1.803 m)   Wt (!) 321 lb 3.2 oz (145.7 kg)   SpO2 95%   BMI 44.80 kg/m  , BMI Body mass index is 44.8 kg/m.  Wt Readings from Last 3 Encounters:  07/14/21 (!) 321 lb 3.2 oz (145.7 kg)  07/01/21 (!) 328 lb 4.2 oz (148.9 kg)    GEN: Well nourished, overweight, well developed, in no acute distress. HEENT: normal. Neck: Supple, no JVD, carotid bruits, or masses. Cardiac: RRR, no murmurs, rubs, or gallops. No clubbing, cyanosis, edema.  Radials/PT 2+ and equal bilaterally.  Respiratory:  Respirations regular and unlabored, clear to auscultation bilaterally. GI: Soft, nontender, nondistended. MS: No deformity or atrophy. Skin: Warm and dry, no rash. Neuro:  Strength and sensation are intact. Psych: Normal affect.  Assessment & Plan    CAD - Stable with no anginal symptoms. No indication for ischemic evaluation.  ***    HLD, LDL goal <70 -06/29/21 total cholesterol 142, triglycerides 63, HDL 37, LDL 92.  On atorvastatin 80 mg daily after recent admission.  Plan for repeat FLP/LFT in 4 weeks.  If LDL not at goal less than 70 consider addition of Zetia.  {The patient has an active order for outpatient cardiac rehabilitation.   Please indicate if the patient is ready to start. Do NOT delete this.  It will auto delete.  Refresh note, then sign.              Click here to document readiness and see contraindications.  :1}  Cardiac Rehabilitation Eligibility Assessment  The patient is ready to start cardiac rehabilitation from a cardiac standpoint.   Disposition: Follow up in 2-3 month(s) with Bridgette Christopher, MD or APP.  Signed, Caitlin S Walker, NP 07/14/2021, 12:27 PM [facility] 

## 2021-07-14 NOTE — Patient Instructions (Addendum)
Medication Instructions:  Continue your current medications.   *If you need a refill on your cardiac medications before your next appointment, please call your pharmacy*   Lab Work: Your physician recommends that you return for lab work in 1 month for fasting lipid panel and CMP  Please return for Lab work. You may come to the...   [facility] (3rd floor) [address] [place], [place] [address]  Open: 8am-Noon and 1pm-4:30pm   [facility] at [facility] [address]- Any location  **no appointments needed**   If you have labs (blood work) drawn today and your tests are completely normal, you will receive your results only by: [employer] (if you have MyChart) OR A paper copy in the mail If you have any lab test that is abnormal or we need to change your treatment, we will call you to review the results.   Testing/Procedures: None ordered today.    Follow-Up: At [facility], you and your health needs are our priority.  As part of our continuing mission to provide you with exceptional heart care, we have created designated Provider Care Teams.  These Care Teams include your primary Cardiologist (physician) and Advanced Practice Providers (APPs -  Physician Assistants and Nurse Practitioners) who all work together to provide you with the care you need, when you need it.  We recommend signing up for the patient portal called "MyChart".  Sign up information is provided on this After Visit Summary.  MyChart is used to connect with patients for Virtual Visits (Telemedicine).  Patients are able to view lab/test results, encounter notes, upcoming appointments, etc.  Non-urgent messages can be sent to your provider as well.   To learn more about what you can do with MyChart, go to [email or web address].    Your next appointment:   2-3 month(s)  The format for your next appointment:   In Person  Provider:   Jodelle Red, MD or Alver Sorrow, NP    Other Instructions  For coronary artery disease often called "heart disease" we aim for optimal guideline directed medical therapy. We use the "A, B, C"s to help keep [place] on track!  A = Aspirin 81mg  daily B = Beta blocker which helps to relax the heart. This is your Metoprolol. C = Cholesterol control. You take Atorvastatin to help control your cholesterol.  D = Don't forget nitroglycerin! This is an emergency tablet to be used if you have chest pain. E = Extras. In your case, this is Clopidogrel (Plavix) for one year after your heart attack   Heart Healthy Diet Recommendations: A low-salt diet is recommended. Meats should be grilled, baked, or boiled. Avoid fried foods. Focus on lean protein sources like fish or chicken with vegetables and fruits. The American Heart Association is a !  American Heart Association Diet and Lifeystyle Recommendations   Exercise recommendations: The American Heart Association recommends 150 minutes of moderate intensity exercise weekly. Try 30 minutes of moderate intensity exercise 4-5 times per week. This could include walking, jogging, or swimming.   Important Information About Sugar

## 2021-07-16 ENCOUNTER — Other Ambulatory Visit (HOSPITAL_COMMUNITY): Payer: Self-pay

## 2021-07-16 ENCOUNTER — Telehealth (HOSPITAL_COMMUNITY): Payer: Self-pay

## 2021-07-16 NOTE — Telephone Encounter (Signed)
Pharmacy Transitions of Care Follow-up Telephone Call  Date of discharge: 07/01/21  Discharge Diagnosis: STEMI  How have you been since you were released from the hospital? Patient doing well but does get a little dizzy at times -- thinks it was caused by Plavix.    Medication changes made at discharge: START taking: Aspirin Low Dose (aspirin EC)  Clopidogrel STOP taking: aspirin 325 MG tablet  Replaced by a similar medication. ibuprofen 200 MG tablet (ADVIL)   Medication changes verified by the patient? Yes   Medication Accessibility:  Home Pharmacy: [facility]   Was the patient provided with refills on discharged medications?  Yes -- but provider has already reordered medications   Have all prescriptions been transferred from [facility] to home pharmacy? N/A   Is the patient able to afford medications? Yes   Medication Review: CLOPIDOGREL (PLAVIX) Clopidogrel 75 mg once daily.  - Reviewed potential DDIs with patient  - Advised patient of medications to avoid (NSAIDs, ASA)  - Educated that Tylenol (acetaminophen) will be the preferred analgesic to prevent risk of bleeding  - Emphasized importance of monitoring for signs and symptoms of bleeding (abnormal bruising, prolonged bleeding, nose bleeds, bleeding from gums, discolored urine, black tarry stools)  - Advised patient to alert all providers of anticoagulation therapy prior to starting a new medication or having a procedure   Follow-up Appointments:  PCP Hospital f/u appt confirmed? Saw Alver Sorrow, NP on 07/14/21.   Specialist Hospital f/u appt confirmed?  Scheduled to see Anders Simmonds, PA-C on 07/23/21 @ 8:30 am.   If their condition worsens, is the pt aware to call PCP or go to the Emergency Dept.? Yes  Final Patient Assessment: -Pt is doing well.  -Pt verbalized understanding of clopidogrel.  -Declined patient education -Pt has post discharge appointment. Made sure he knew to take printed coupons from PCP to  [facility] pharmacy to get discounted prices.

## 2021-07-16 NOTE — Telephone Encounter (Signed)
Transitions of Care Pharmacy  ° °Call attempted for a pharmacy transitions of care follow-up. HIPAA appropriate voicemail was left with call back information provided.  ° °Call attempt #1. Will follow-up in 2-3 days.  °  °

## 2021-07-22 NOTE — Progress Notes (Unsigned)
Patient ID: Victor Marquez, male   DOB: 1985/03/19, 36 y.o.   MRN: [id number]   After hospitalization for STEMI 5/8-5/11/2021.  Seen subsequently by cardiology 07/14/2021.    From discharge summary: Victor Marquez is a 36 y.o. male without prior cardiac history who presented 06/29/21 with chest tightness. He rated this an 8/10, associated with shortness of breath and nausea. He chewed an aspirin at home and sought emergency medical care. His symptoms were improved on evaluation in the ER.  ER evaluation was notable for ST elevation most prominent in the inferior and lateral leads. Initial hsTn was 325. He was brought to the cath lab for invasive coronary angiography. This showed no focal proximal stenosis in any vessels and generally normal coronaries, with the exception of the distal LAD. This appeared diffusely narrowed, equivalent to 95% stenosis of small caliber vessels. It was unclear whether this explained his symptoms. He was recommended for evaluation for myopericarditis and initially started on ibuprofen and colchicine. His ESR was normal and his CRP was 1.5. HsTn rose to 3003. Echo showed very focal apical wall motion abnormality and otherwise normal EF. ST elevations were resolving. Given the above findings, there was very high suspicion that this was likely an acute coronary syndrome. To definitively determine this, he underwent cardiac MRI, which showed focal apical infarct consistent with MI and no evidence of myocarditis.   He was feeling well, without chest pain. Ibuprofen and colchicine were stopped, and he was started on aspirin, clopidogrel, atorvastatin, and metoprolol this admission.   His lipids were unremarkable, with tchol 142, HDL 37, LDL 92, and TG 63. His lp(a) was normal at 19.6. He does have a family history of premature heart disease, but the mechanism of this is unclear.   We discussed activity limitations, cardiac rehab, and lifestyle management. He was stable for discharge to home  after being evaluated by cardiac rehab.  From cardiology visit 07/14/2021: Victor Marquez is a 36 y.o. male with a hx of CAD, hyperlipidemia last seen while hospitalized.   Admitted 06/29/2021 with ST elevation most prominent in inferior and lateral leads.  Initial high-sensitivity troponin 325.  Cardiac catheterization showed no focal proximal stenosis in any vessels and generally normal coronaries with the exception of distal LAD.  This appeared diffusely nearly equivalent to 95% stenosis of small caliber vessels.  He was worked up for myopericarditis with ESR normal, CRP 1.5.  Echo with focal apical wall motion ability otherwise normal LVEF. Cardiac MRI showing focal apical infarct consistent with MI no evidence of myocarditis.  He is discharged on aspirin, clopidogrel, atorvastatin, metoprolol.   Presents today for follow up with his friend and roommate. Feeling well since discharge. Has been walking twice per day for 15 minutes on his breaks at work. Reports no shortness of breath nor dyspnea on exertion. Reports no chest pain, pressure, or tightness. No edema, orthopnea, PND. Reports no palpitations.  Does not presently have insurance as he reports it is too expensive - we utilized [employer]Rx in clinic to find his Atorvastatin, Metoprolol, and Plavix for $35 for 90 day supply at [facility]. Reviewed cardiac testing, medications in detail. Shares with me that he may be moving to [place], [place] ina few months and we discussed the importance of establishing with cardiology there.   CAD - Cath 06/29/21 with distal LAD lesion 95% stenosed recommended for medical management. Echo with normal LVEF. cMRI with no evidence of myocarditis. Stable with no anginal symptoms. No indication for ischemic evaluation.  Radial  cath site appropriately healed. GDMT includes aspirin, metoprolol, atorvastatin. Refills provided to [facility] ($35 for 90 day supply of all of his medications self pay). Heart healthy diet and regular  cardiovascular exercise encouraged.     HLD, LDL goal <70 -06/29/21 total cholesterol 142, triglycerides 63, HDL 37, LDL 92.  On atorvastatin 80 mg daily after recent admission.  Plan for repeat FLP/LFT in 4 weeks.  If LDL not at goal less than 70 consider addition of Zetia.   Obesity - Weight loss via diet and exercise encouraged. Discussed the impact being overweight would have on cardiovascular risk. Has made significant dietary changes - congratulated him. Would be future candidate for GLP1 but cost prohibitive due to lack of insurance.      Cardiac Rehabilitation Eligibility Assessment  The patient is ready to start cardiac rehabilitation from a cardiac standpoint.May be cost prohibitive due to lack of insurance.

## 2021-07-23 ENCOUNTER — Encounter: Payer: Self-pay | Admitting: Physician Assistant

## 2021-07-23 ENCOUNTER — Ambulatory Visit: Payer: Self-pay | Attending: Physician Assistant | Admitting: Physician Assistant

## 2021-07-23 VITALS — BP 117/76 | HR 75 | Ht 71.0 in | Wt 319.8 lb

## 2021-07-23 DIAGNOSIS — I2102 ST elevation (STEMI) myocardial infarction involving left anterior descending coronary artery: Secondary | ICD-10-CM

## 2021-07-23 DIAGNOSIS — E663 Overweight: Secondary | ICD-10-CM

## 2021-07-23 DIAGNOSIS — Z09 Encounter for follow-up examination after completed treatment for conditions other than malignant neoplasm: Secondary | ICD-10-CM

## 2021-08-15 LAB — COMPREHENSIVE METABOLIC PANEL
ALT: 26 IU/L (ref 0–44)
AST: 23 IU/L (ref 0–40)
Albumin/Globulin Ratio: 1.6 (ref 1.2–2.2)
Albumin: 4.5 g/dL (ref 4.0–5.0)
Alkaline Phosphatase: 78 IU/L (ref 44–121)
BUN/Creatinine Ratio: 11 (ref 9–20)
BUN: 10 mg/dL (ref 6–20)
Bilirubin Total: 0.8 mg/dL (ref 0.0–1.2)
CO2: 24 mmol/L (ref 20–29)
Calcium: 9.5 mg/dL (ref 8.7–10.2)
Chloride: 104 mmol/L (ref 96–106)
Creatinine, Ser: 0.93 mg/dL (ref 0.76–1.27)
Globulin, Total: 2.9 g/dL (ref 1.5–4.5)
Glucose: 100 mg/dL — ABNORMAL HIGH (ref 70–99)
Potassium: 4.5 mmol/L (ref 3.5–5.2)
Sodium: 142 mmol/L (ref 134–144)
Total Protein: 7.4 g/dL (ref 6.0–8.5)
eGFR: 111 mL/min/{1.73_m2} (ref >59)

## 2021-08-15 LAB — LIPID PANEL
Chol/HDL Ratio: 2.4 ratio (ref 0.0–5.0)
Cholesterol, Total: 78 mg/dL — ABNORMAL LOW (ref 100–199)
HDL: 33 mg/dL — ABNORMAL LOW (ref >39)
LDL Chol Calc (NIH): 29 mg/dL (ref 0–99)
Triglycerides: 71 mg/dL (ref 0–149)
VLDL Cholesterol Cal: 16 mg/dL (ref 5–40)

## 2021-09-13 NOTE — Progress Notes (Signed)
Office Visit    Patient Name: Victor Marquez Date of Encounter: 09/14/2021  PCP:  Patient, No Pcp Per   [facility]  Cardiologist:  Jodelle Red, MD  Advanced Practice Provider:  No care team member to display Electrophysiologist:  None      Chief Complaint    Victor Marquez is a 36 y.o. male with a hx of CAD, hyperlipidemia presents today for CAD follow up  Past Medical History    No past medical history on file. Past Surgical History:  Procedure Laterality Date   LEFT HEART CATH AND CORONARY ANGIOGRAPHY N/A 06/29/2021   Procedure: LEFT HEART CATH AND CORONARY ANGIOGRAPHY;  Surgeon: Orbie Pyo, MD;  Location: [facility];  Service: Cardiovascular;  Laterality: N/A;   Allergies  No Known Allergies  History of Present Illness    Victor Marquez is a 36 y.o. male with a hx of CAD, hyperlipidemia last seen 07/14/21.  Admitted 06/29/2021 with ST elevation most prominent in inferior and lateral leads.  Initial high-sensitivity troponin 325.  Cardiac catheterization showed no focal proximal stenosis in any vessels and generally normal coronaries with the exception of distal LAD.  This appeared diffusely nearly equivalent to 95% stenosis of small caliber vessels.  He was worked up for myopericarditis with ESR normal, CRP 1.5.  Echo with focal apical wall motion ability otherwise normal LVEF. Cardiac MRI showing focal apical infarct consistent with MI no evidence of myocarditis.  He is discharged on aspirin, clopidogrel, atorvastatin, metoprolol.  At follow up 07/14/21 doing well since discharge. No changes were made. Medication refills sent to [facility] as it was more cost effective with his lack of insurance.   Presents today for follow up with his friend and roommate.***   Feeling well since discharge. Has been walking twice per day for 15 minutes on his breaks at work. Reports no shortness of breath nor dyspnea on exertion. Reports no chest pain,  pressure, or tightness. No edema, orthopnea, PND. Reports no palpitations.  Does not presently have insurance as he reports it is too expensive - we utilized [employer] in clinic to find his Atorvastatin, Metoprolol, and Plavix for $35 for 90 day supply at [facility]. Reviewed cardiac testing, medications in detail. Shares with me that he may be moving to [place], [place] ina few months and we discussed the importance of establishing with cardiology there.   ***  EKGs/Labs/Other Studies Reviewed:   The following studies were reviewed today:    Coronary Angiography 06/29/21   Dist LAD lesion is 95% stenosed.   LV end diastolic pressure is moderately elevated.   The left ventricular ejection fraction is 55-65% by visual estimate.   1.  Moderate to severe distal of the distal LAD which is small caliber with no other high-grade obstructions or occlusions. 2.  LVEDP of 24 mmHg with preserved LV function.   Recommendation: Assess CRP and echocardiogram with presumptive diagnosis of pericarditis.  Treat with secondary prevention measures due to distal LAD disease.   Echocardiogram 06/30/21  1. Akinesis of the apical septum, apical inferior segment, and aepx.  Myocardium appears thinned concerning for distal LAD infarction. No  thrombus on contrast imaging. Left ventricular ejection fraction, by  estimation, is 55 to 60%. The left ventricle  has normal function. The left ventricle demonstrates regional wall motion  abnormalities (see scoring diagram/findings for description). Left  ventricular diastolic parameters were normal.   2. Right ventricular systolic function is normal. The right ventricular  size is normal. Tricuspid regurgitation signal is inadequate for assessing  PA pressure.   3. The mitral valve is grossly normal. No evidence of mitral valve  regurgitation. No evidence of mitral stenosis.   4. The aortic valve is tricuspid. Aortic valve regurgitation is not  visualized. No aortic stenosis  is present.   5. The inferior vena cava is normal in size with <50% respiratory  variability, suggesting right atrial pressure of 8 mmHg.    Cardiac MRI 06/30/21 FINDINGS: Left ventricle:   -Normal size   -Overall normal systolic function with akinesis of apical inferior/septal walls and apex   -Normal ECV (22%)   -Normal global and regional T2 values   -Subendocardial LGE consistent with infarct in apical inferior/septal walls and apex. LGE is greater than 50% transmural suggesting nonviability in this territory, though infarct size is overestimated in setting of acute MI   LV EF: 56% (Normal 56-78%)   Absolute volumes:   LV EDV: 137mL (Normal 77-195 mL)  LV ESV: 61mL (Normal 19-72 mL)  LV SV: 164mL (Normal 51-133 mL)  CO: 8.2L/min (Normal 2.8-8.8 L/min)   Indexed volumes:   LV EDV: 58mL/sq-m (Normal 47-92 mL/sq-m)  LV ESV: 49mL/sq-m (Normal 13-30 mL/sq-m)  LV SV: 63mL/sq-m (Normal 32-62 mL/sq-m)  CI: 3.0L/min/sq-m (Normal 1.7-4.2 L/min/sq-m)   Right ventricle: Normal size and systolic function   RV EF:  52% (Normal 47-74%)     IMPRESSION: 1.  No evidence of myocarditis   2. Subendocardial late gadolinium enhancement in apical inferior/septal walls and apex, consistent with distal LAD infarct. LGE is greater than 50% transmural suggesting nonviability in this territory, though infarct size is overestimated in setting of acute MI   3. Normal LV size and overall normal systolic function (EF 16%) with akinesis of apical inferior/septal walls and apex   4.  Normal RV size and systolic function (EF 10%) _____________  EKG: EKG ordered today. EKG performed today NSR 68 bpm with no acute ST/T wave changes.   Recent Labs: 06/30/2021: Hemoglobin 13.6; Platelets 269; TSH 1.120 08/14/2021: ALT 26; BUN 10; Creatinine, Ser 0.93; Potassium 4.5; Sodium 142  Recent Lipid Panel    Component Value Date/Time   CHOL 78 (L) 08/14/2021 1052   TRIG 71 08/14/2021 1052   HDL 33  (L) 08/14/2021 1052   CHOLHDL 2.4 08/14/2021 1052   CHOLHDL 3.8 06/29/2021 2049   VLDL 13 06/29/2021 2049   LDLCALC 29 08/14/2021 1052    Home Medications   Current Meds  Medication Sig   aspirin 81 MG EC tablet Take 1 tablet (81 mg total) by mouth daily. Swallow whole.   atorvastatin (LIPITOR) 80 MG tablet Take 1 tablet (80 mg total) by mouth daily.   clopidogrel (PLAVIX) 75 MG tablet Take 1 tablet (75 mg total) by mouth daily.   metoprolol succinate (TOPROL-XL) 25 MG 24 hr tablet Take 1 tablet (25 mg total) by mouth daily.   nitroGLYCERIN (NITROSTAT) 0.4 MG SL tablet Place 1 tablet (0.4 mg total) under the tongue every 5 (five) minutes x 3 doses as needed for chest pain.     Review of Systems      All other systems reviewed and are otherwise negative except as noted above.  Physical Exam    VS:  BP 116/78   Pulse 68   Ht $R'5\' 11"'PV$  (1.803 m)   Wt (!) 307 lb (139.3 kg)   BMI 42.82 kg/m  , BMI Body mass index is 42.82 kg/m.  Wt Readings from  Last 3 Encounters:  09/14/21 (!) 307 lb (139.3 kg)  07/23/21 (!) 319 lb 12.8 oz (145.1 kg)  07/14/21 (!) 321 lb 3.2 oz (145.7 kg)   *** GEN: Well nourished, overweight, well developed, in no acute distress. HEENT: normal. Neck: Supple, no JVD, carotid bruits, or masses. Cardiac: RRR, no murmurs, rubs, or gallops. No clubbing, cyanosis, edema.  Radials/PT 2+ and equal bilaterally.  Respiratory:  Respirations regular and unlabored, clear to auscultation bilaterally. GI: Soft, nontender, nondistended. MS: No deformity or atrophy. Skin: Warm and dry, no rash. Neuro:  Strength and sensation are intact. Psych: Normal affect.  Assessment & Plan    CAD - Cath 06/29/21 with distal LAD lesion 95% stenosed recommended for medical management. Echo with normal LVEF. cMRI with no evidence of myocarditis. Stable with no anginal symptoms. No indication for ischemic evaluation.  Radial cath site appropriately healed. GDMT includes aspirin, metoprolol,  atorvastatin. Refills provided to [facility] ($35 for 90 day supply of all of his medications self pay). Heart healthy diet and regular cardiovascular exercise encouraged.  ***  HLD, LDL goal <70 -06/29/21 total cholesterol 142, triglycerides 63, HDL 37, LDL 92.  On atorvastatin 80 mg daily after admission. Repeat lipid panel 08/14/21 with total cholesterol 78, triglycerides 71, HDl 33, LDL 29. ***    Obesity - Weight loss via diet and exercise encouraged. Discussed the impact being overweight would have on cardiovascular risk. Has made significant dietary changes - congratulated him. Would be future candidate for GLP1 but cost prohibitive due to lack of insurance. ***  Disposition: Follow up in 2-3 month(s) **with Buford Dresser, MD or APP.  Signed, Loel Dubonnet, NP 09/14/2021, 4:30 PM [place]

## 2021-09-14 ENCOUNTER — Encounter (HOSPITAL_BASED_OUTPATIENT_CLINIC_OR_DEPARTMENT_OTHER): Payer: Self-pay | Admitting: Family

## 2021-09-14 ENCOUNTER — Ambulatory Visit (INDEPENDENT_AMBULATORY_CARE_PROVIDER_SITE_OTHER): Payer: Self-pay | Admitting: Family

## 2021-09-14 VITALS — BP 116/78 | HR 68 | Ht 71.0 in | Wt 307.0 lb

## 2021-09-14 DIAGNOSIS — I10 Essential (primary) hypertension: Secondary | ICD-10-CM

## 2021-09-14 DIAGNOSIS — E785 Hyperlipidemia, unspecified: Secondary | ICD-10-CM

## 2021-09-14 NOTE — Patient Instructions (Signed)
Medication Instructions:  Your Physician recommend you continue on your current medication as directed.    *If you need a refill on your cardiac medications before your next appointment, please call your pharmacy*  Follow-Up: At [facility], you and your health needs are our priority.  As part of our continuing mission to provide you with exceptional heart care, we have created designated Provider Care Teams.  These Care Teams include your primary Cardiologist (physician) and Advanced Practice Providers (APPs -  Physician Assistants and Nurse Practitioners) who all work together to provide you with the care you need, when you need it.  We recommend signing up for the patient portal called "MyChart".  Sign up information is provided on this After Visit Summary.  MyChart is used to connect with patients for Virtual Visits (Telemedicine).  Patients are able to view lab/test results, encounter notes, upcoming appointments, etc.  Non-urgent messages can be sent to your provider as well.   To learn more about what you can do with MyChart, go to [email or web address].    Your next appointment:   As needed- When you run out of refills and move just call us and we will send to your new pharmacy.   Other Instructions Heart Healthy Diet Recommendations: A low-salt diet is recommended. Meats should be grilled, baked, or boiled. Avoid fried foods. Focus on lean protein sources like fish or chicken with vegetables and fruits. The American Heart Association is a Chief Technology Officer!  American Heart Association Diet and Lifeystyle Recommendations   Exercise recommendations: The American Heart Association recommends 150 minutes of moderate intensity exercise weekly. Try 30 minutes of moderate intensity exercise 4-5 times per week. This could include walking, jogging, or swimming.   Important Information About Sugar

## 2021-09-15 ENCOUNTER — Telehealth: Payer: Self-pay | Admitting: Licensed Clinical Social Worker

## 2021-09-15 ENCOUNTER — Encounter (HOSPITAL_BASED_OUTPATIENT_CLINIC_OR_DEPARTMENT_OTHER): Payer: Self-pay | Admitting: Family

## 2021-09-15 NOTE — Telephone Encounter (Signed)
H&V Care Navigation CSW Progress Note  Clinical Social Worker contacted patient by phone to f/u on CAFA application mailed to pt yesterday at request of care team. No answer at [phone]. Message left requesting call back, will re-attempt as able.   Patient is participating in a Managed Medicaid Plan: No, self pay only  SDOH Screenings   Alcohol Screen: Not on file  Depression (PHQ2-9): Low Risk  (07/23/2021)   Depression (PHQ2-9)    PHQ-2 Score: 0  Financial Resource Strain: Not on file  Food Insecurity: Not on file  Housing: Not on file  Physical Activity: Not on file  Social Connections: Not on file  Stress: Not on file  Tobacco Use: Low Risk  (09/15/2021)   Patient History    Smoking Tobacco Use: Never    Smokeless Tobacco Use: Never    Passive Exposure: Not on file  Transportation Needs: Not on file    Octavio Graves, MSW, LCSW Clinical Social Worker II [facility] Heart/Vascular Care Navigation  [phone]- work cell phone (preferred) [phone]- desk phone

## 2021-09-18 ENCOUNTER — Telehealth: Payer: Self-pay | Admitting: Licensed Clinical Social Worker

## 2021-09-18 NOTE — Telephone Encounter (Signed)
H&V Care Navigation CSW Progress Note  Clinical Social Worker contacted patient by phone to f/u on assistance applications mailed at request of care team. Again no answer at [phone], second voicemail left for pt. Will re-attempt as able.   Patient is participating in a Managed Medicaid Plan:  No, self pay.  SDOH Screenings   Alcohol Screen: Not on file  Depression (PHQ2-9): Low Risk  (07/23/2021)   Depression (PHQ2-9)    PHQ-2 Score: 0  Financial Resource Strain: Not on file  Food Insecurity: Not on file  Housing: Not on file  Physical Activity: Not on file  Social Connections: Not on file  Stress: Not on file  Tobacco Use: Low Risk  (09/15/2021)   Patient History    Smoking Tobacco Use: Never    Smokeless Tobacco Use: Never    Passive Exposure: Not on file  Transportation Needs: Not on file   Octavio Graves, MSW, LCSW Clinical Social Worker II [facility] Heart/Vascular Care Navigation  [phone]- work cell phone (preferred) [phone]- desk phone

## 2021-09-21 ENCOUNTER — Telehealth: Payer: Self-pay | Admitting: Licensed Clinical Social Worker

## 2021-09-21 NOTE — Telephone Encounter (Signed)
H&V Care Navigation CSW Progress Note  Clinical Social Worker contacted patient by phone to f/u on CAFA application. No answer again at [phone]. Third voicemail left requesting call back, at this time will not actively follow by remain available as needed should pt wish to engage with care navigation assistance.   Patient is participating in a Managed Medicaid Plan:  No, self pay only  SDOH Screenings   Alcohol Screen: Not on file  Depression (PHQ2-9): Low Risk  (07/23/2021)   Depression (PHQ2-9)    PHQ-2 Score: 0  Financial Resource Strain: Not on file  Food Insecurity: Not on file  Housing: Not on file  Physical Activity: Not on file  Social Connections: Not on file  Stress: Not on file  Tobacco Use: Low Risk  (09/15/2021)   Patient History    Smoking Tobacco Use: Never    Smokeless Tobacco Use: Never    Passive Exposure: Not on file  Transportation Needs: Not on file   Octavio Graves, MSW, LCSW Clinical Social Worker II [facility] Heart/Vascular Care Navigation  [phone]- work cell phone (preferred) [phone]- desk phone

## 2021-09-27 ENCOUNTER — Encounter (HOSPITAL_BASED_OUTPATIENT_CLINIC_OR_DEPARTMENT_OTHER): Payer: Self-pay

## 2021-10-28 ENCOUNTER — Ambulatory Visit: Payer: Self-pay | Attending: Nurse Practitioner | Admitting: Nurse Practitioner

## 2021-10-28 ENCOUNTER — Encounter: Payer: Self-pay | Admitting: Nurse Practitioner

## 2021-10-28 VITALS — BP 118/72 | HR 70 | Temp 98.3°F | Ht 70.0 in | Wt 284.6 lb

## 2021-10-28 DIAGNOSIS — Z7689 Persons encountering health services in other specified circumstances: Secondary | ICD-10-CM

## 2021-10-28 DIAGNOSIS — R030 Elevated blood-pressure reading, without diagnosis of hypertension: Secondary | ICD-10-CM

## 2021-10-28 DIAGNOSIS — Z1159 Encounter for screening for other viral diseases: Secondary | ICD-10-CM

## 2021-10-28 DIAGNOSIS — D72829 Elevated white blood cell count, unspecified: Secondary | ICD-10-CM

## 2021-10-28 DIAGNOSIS — I2102 ST elevation (STEMI) myocardial infarction involving left anterior descending coronary artery: Secondary | ICD-10-CM

## 2021-10-28 NOTE — Progress Notes (Signed)
Assessment & Plan:  Victor Marquez was seen today for establish care.  Diagnoses and all orders for this visit:  Encounter to establish care  Need for hepatitis C screening test -     HCV Ab w Reflex to Quant PCR  Leukocytosis, unspecified type -     CBC with Differential  ST elevation myocardial infarction involving left anterior descending (LAD) coronary artery  Continue all medications as prescribed.   Elevated blood pressure reading Return for BP device check.    Patient has been counseled on age-appropriate routine health concerns for screening and prevention. These are reviewed and up-to-date. Referrals have been placed accordingly. Immunizations are up-to-date or declined.    Subjective:   Chief Complaint  Patient presents with   Establish Care   HPI Victor Marquez 36 y.o. male presents to office today to establish care. He states he will be moving to [place] next month permanently.   He has a past medical history of CAD, HPL STEMI involving left anterior descending coronary artery  (06/29/2021).   Recent STEMI (06-29-2021). Distal LAD 95% stenosis (medical mgmt recommended). ECHO normal LVEF. Currently taking atorvastatin, metoprolol and plavix.   Blood pressure is well controlled. He has made significant dietary and lifestyle changes. Weight is down about 20lbs. He does state his blood pressure device is reading higher at home. We will have him return to office for device correlation.  BP Readings from Last 3 Encounters:  10/28/21 118/72  09/14/21 116/78  07/23/21 117/76    Review of Systems  Constitutional:  Negative for fever, malaise/fatigue and weight loss.  HENT: Negative.  Negative for nosebleeds.   Eyes: Negative.  Negative for blurred vision, double vision and photophobia.  Respiratory: Negative.  Negative for cough and shortness of breath.   Cardiovascular: Negative.  Negative for chest pain, palpitations and leg swelling.  Gastrointestinal: Negative.  Negative for  heartburn, nausea and vomiting.  Musculoskeletal: Negative.  Negative for myalgias.  Neurological: Negative.  Negative for dizziness, focal weakness, seizures and headaches.  Psychiatric/Behavioral: Negative.  Negative for suicidal ideas.     Past Medical History:  Diagnosis Date   STEMI involving left anterior descending coronary artery (HCC) 06/29/2021    Past Surgical History:  Procedure Laterality Date   LEFT HEART CATH AND CORONARY ANGIOGRAPHY N/A 06/29/2021   Procedure: LEFT HEART CATH AND CORONARY ANGIOGRAPHY;  Surgeon: Orbie Pyo, MD;  Location: [facility];  Service: Cardiovascular;  Laterality: N/A;    Family History  Problem Relation Age of Onset   Heart disease Father 52       History of CABG.    Social History Reviewed with no changes to be made today.   Outpatient Medications Prior to Visit  Medication Sig Dispense Refill   aspirin 81 MG EC tablet Take 1 tablet (81 mg total) by mouth daily. Swallow whole. 30 tablet 11   atorvastatin (LIPITOR) 80 MG tablet Take 1 tablet (80 mg total) by mouth daily. 90 tablet 3   clopidogrel (PLAVIX) 75 MG tablet Take 1 tablet (75 mg total) by mouth daily. 90 tablet 3   metoprolol succinate (TOPROL-XL) 25 MG 24 hr tablet Take 1 tablet (25 mg total) by mouth daily. 90 tablet 3   nitroGLYCERIN (NITROSTAT) 0.4 MG SL tablet Place 1 tablet (0.4 mg total) under the tongue every 5 (five) minutes x 3 doses as needed for chest pain. 25 tablet 12   No facility-administered medications prior to visit.    No Known  Allergies     Objective:    BP 118/72   Pulse 70   Temp 98.3 F (36.8 C) (Oral)   Ht 5\' 10"  (1.778 m)   Wt 284 lb 9.6 oz (129.1 kg)   SpO2 98%   BMI 40.84 kg/m  Wt Readings from Last 3 Encounters:  10/28/21 284 lb 9.6 oz (129.1 kg)  09/14/21 (!) 307 lb (139.3 kg)  07/23/21 (!) 319 lb 12.8 oz (145.1 kg)    Physical Exam Vitals and nursing note reviewed.  Constitutional:      Appearance: He is  well-developed.  HENT:     Head: Normocephalic and atraumatic.  Cardiovascular:     Rate and Rhythm: Normal rate and regular rhythm.     Heart sounds: Normal heart sounds. No murmur heard.    No friction rub. No gallop.  Pulmonary:     Effort: Pulmonary effort is normal. No tachypnea or respiratory distress.     Breath sounds: Normal breath sounds. No decreased breath sounds, wheezing, rhonchi or rales.  Chest:     Chest wall: No tenderness.  Abdominal:     General: Bowel sounds are normal.     Palpations: Abdomen is soft.  Musculoskeletal:        General: Normal range of motion.     Cervical back: Normal range of motion.  Skin:    General: Skin is warm and dry.  Neurological:     Mental Status: He is alert and oriented to person, place, and time.     Coordination: Coordination normal.  Psychiatric:        Behavior: Behavior normal. Behavior is cooperative.        Thought Content: Thought content normal.        Judgment: Judgment normal.          Patient has been counseled extensively about nutrition and exercise as well as the importance of adherence with medications and regular follow-up. The patient was given clear instructions to go to ER or return to medical center if symptoms don't improve, worsen or new problems develop. The patient verbalized understanding.   Follow-up: Return for return for device check October 3rd. 10-30[birth year]1992, FNP-BC [facility] and [facility], [place]   10/28/2021, 11:26 AM

## 2021-10-29 LAB — CBC WITH DIFFERENTIAL/PLATELET
Basophils Absolute: 0.1 10*3/uL (ref 0.0–0.2)
Basos: 1 %
EOS (ABSOLUTE): 0.4 10*3/uL (ref 0.0–0.4)
Eos: 4 %
Hematocrit: 40.4 % (ref 37.5–51.0)
Hemoglobin: 13.6 g/dL (ref 13.0–17.7)
Immature Grans (Abs): 0 10*3/uL (ref 0.0–0.1)
Immature Granulocytes: 0 %
Lymphocytes Absolute: 2.2 10*3/uL (ref 0.7–3.1)
Lymphs: 23 %
MCH: 28 pg (ref 26.6–33.0)
MCHC: 33.7 g/dL (ref 31.5–35.7)
MCV: 83 fL (ref 79–97)
Monocytes Absolute: 0.5 10*3/uL (ref 0.1–0.9)
Monocytes: 5 %
Neutrophils Absolute: 6.6 10*3/uL (ref 1.4–7.0)
Neutrophils: 67 %
Platelets: 271 10*3/uL (ref 150–450)
RBC: 4.85 x10E6/uL (ref 4.14–5.80)
RDW: 12.3 % (ref 11.6–15.4)
WBC: 9.8 10*3/uL (ref 3.4–10.8)

## 2021-10-29 LAB — HCV INTERPRETATION

## 2021-10-29 LAB — HCV AB W REFLEX TO QUANT PCR: HCV Ab: NONREACTIVE

## 2021-11-24 ENCOUNTER — Encounter: Payer: Self-pay | Admitting: Nurse Practitioner

## 2021-12-30 ENCOUNTER — Encounter (HOSPITAL_BASED_OUTPATIENT_CLINIC_OR_DEPARTMENT_OTHER): Payer: Self-pay

## 2022-08-23 ENCOUNTER — Encounter (HOSPITAL_BASED_OUTPATIENT_CLINIC_OR_DEPARTMENT_OTHER): Payer: Self-pay
